# Patient Record
Sex: Female | Born: 1980 | Race: White | Hispanic: No | Marital: Married | Smoking: Never smoker
Health system: Southern US, Community
[De-identification: ages and names within clinical notes are randomized; demographics above are authoritative.]

## PROBLEM LIST (undated history)

## (undated) DIAGNOSIS — E669 Obesity, unspecified: Secondary | ICD-10-CM

## (undated) DIAGNOSIS — H9193 Unspecified hearing loss, bilateral: Secondary | ICD-10-CM

## (undated) DIAGNOSIS — R7989 Other specified abnormal findings of blood chemistry: Secondary | ICD-10-CM

## (undated) DIAGNOSIS — G43909 Migraine, unspecified, not intractable, without status migrainosus: Secondary | ICD-10-CM

## (undated) DIAGNOSIS — R599 Enlarged lymph nodes, unspecified: Secondary | ICD-10-CM

## (undated) DIAGNOSIS — M329 Systemic lupus erythematosus, unspecified: Secondary | ICD-10-CM

## (undated) DIAGNOSIS — R161 Splenomegaly, not elsewhere classified: Secondary | ICD-10-CM

## (undated) DIAGNOSIS — I7300 Raynaud's syndrome without gangrene: Secondary | ICD-10-CM

## (undated) DIAGNOSIS — F419 Anxiety disorder, unspecified: Secondary | ICD-10-CM

## (undated) DIAGNOSIS — D72819 Decreased white blood cell count, unspecified: Secondary | ICD-10-CM

## (undated) DIAGNOSIS — F329 Major depressive disorder, single episode, unspecified: Secondary | ICD-10-CM

## (undated) DIAGNOSIS — K3184 Gastroparesis: Secondary | ICD-10-CM

## (undated) DIAGNOSIS — F32A Depression, unspecified: Secondary | ICD-10-CM

## (undated) DIAGNOSIS — IMO0002 Reserved for concepts with insufficient information to code with codable children: Secondary | ICD-10-CM

## (undated) DIAGNOSIS — R160 Hepatomegaly, not elsewhere classified: Secondary | ICD-10-CM

## (undated) DIAGNOSIS — H9192 Unspecified hearing loss, left ear: Secondary | ICD-10-CM

## (undated) HISTORY — DX: Obesity, unspecified: E66.9

## (undated) HISTORY — DX: Unspecified hearing loss, bilateral: H91.93

## (undated) HISTORY — DX: Unspecified hearing loss, left ear: H91.92

## (undated) HISTORY — DX: Major depressive disorder, single episode, unspecified: F32.9

## (undated) HISTORY — DX: Anxiety disorder, unspecified: F41.9

## (undated) HISTORY — DX: Splenomegaly, not elsewhere classified: R16.1

## (undated) HISTORY — DX: Depression, unspecified: F32.A

## (undated) HISTORY — DX: Systemic lupus erythematosus, unspecified: M32.9

## (undated) HISTORY — PX: CHOLECYSTECTOMY: SHX55

## (undated) HISTORY — DX: Raynaud's syndrome without gangrene: I73.00

## (undated) HISTORY — DX: Other specified abnormal findings of blood chemistry: R79.89

## (undated) HISTORY — DX: Decreased white blood cell count, unspecified: D72.819

## (undated) HISTORY — PX: OTHER SURGICAL HISTORY: SHX169

## (undated) HISTORY — DX: Hepatomegaly, not elsewhere classified: R16.0

## (undated) HISTORY — DX: Enlarged lymph nodes, unspecified: R59.9

## (undated) HISTORY — DX: Migraine, unspecified, not intractable, without status migrainosus: G43.909

## (undated) HISTORY — PX: TONSILLECTOMY AND ADENOIDECTOMY: SUR1326

---

## 2007-09-15 HISTORY — PX: TONSILLECTOMY AND ADENOIDECTOMY: SUR1326

## 2014-04-02 ENCOUNTER — Ambulatory Visit
Admission: RE | Admit: 2014-04-02 | Discharge: 2014-04-02 | Disposition: A | Discharge status: Home / Self Care | Payer: BC Managed Care – PPO | Source: Ambulatory Visit | Attending: Rheumatology | Admitting: Rheumatology

## 2014-04-02 ENCOUNTER — Other Ambulatory Visit: Payer: Self-pay | Admitting: Rheumatology

## 2014-04-02 DIAGNOSIS — R109 Unspecified abdominal pain: Secondary | ICD-10-CM

## 2014-04-02 MED ORDER — IOHEXOL 300 MG/ML  SOLN
125.0000 mL | Freq: Once | INTRAMUSCULAR | Status: AC | PRN
  Administered 2014-04-02: 125 mL via INTRAVENOUS

## 2014-04-06 ENCOUNTER — Other Ambulatory Visit: Payer: Self-pay | Admitting: Rheumatology

## 2014-04-06 ENCOUNTER — Ambulatory Visit
Admission: RE | Admit: 2014-04-06 | Discharge: 2014-04-06 | Disposition: A | Discharge status: Home / Self Care | Payer: BC Managed Care – PPO | Source: Ambulatory Visit | Attending: Rheumatology | Admitting: Rheumatology

## 2014-04-06 DIAGNOSIS — R079 Chest pain, unspecified: Secondary | ICD-10-CM

## 2014-04-06 DIAGNOSIS — R0602 Shortness of breath: Secondary | ICD-10-CM

## 2014-04-06 MED ORDER — IOHEXOL 350 MG/ML SOLN
125.0000 mL | Freq: Once | INTRAVENOUS | Status: AC | PRN
  Administered 2014-04-06: 125 mL via INTRAVENOUS

## 2014-04-13 ENCOUNTER — Other Ambulatory Visit (HOSPITAL_COMMUNITY): Payer: Self-pay | Admitting: Gastroenterology

## 2014-04-13 DIAGNOSIS — R1110 Vomiting, unspecified: Secondary | ICD-10-CM

## 2014-04-19 ENCOUNTER — Encounter (HOSPITAL_COMMUNITY): Payer: Self-pay | Admitting: Emergency Medicine

## 2014-04-19 ENCOUNTER — Inpatient Hospital Stay (HOSPITAL_COMMUNITY)
Admission: EM | Admit: 2014-04-19 | Discharge: 2014-04-23 | DRG: 690 | Disposition: A | Discharge status: Home / Self Care | Payer: BC Managed Care – PPO | Attending: Internal Medicine | Admitting: Internal Medicine

## 2014-04-19 ENCOUNTER — Emergency Department (HOSPITAL_COMMUNITY): Payer: BC Managed Care – PPO

## 2014-04-19 DIAGNOSIS — R509 Fever, unspecified: Secondary | ICD-10-CM | POA: Diagnosis present

## 2014-04-19 DIAGNOSIS — G8929 Other chronic pain: Secondary | ICD-10-CM | POA: Diagnosis present

## 2014-04-19 DIAGNOSIS — E669 Obesity, unspecified: Secondary | ICD-10-CM | POA: Diagnosis present

## 2014-04-19 DIAGNOSIS — E860 Dehydration: Secondary | ICD-10-CM | POA: Diagnosis present

## 2014-04-19 DIAGNOSIS — J40 Bronchitis, not specified as acute or chronic: Secondary | ICD-10-CM | POA: Diagnosis present

## 2014-04-19 DIAGNOSIS — Z8249 Family history of ischemic heart disease and other diseases of the circulatory system: Secondary | ICD-10-CM

## 2014-04-19 DIAGNOSIS — M329 Systemic lupus erythematosus, unspecified: Secondary | ICD-10-CM | POA: Diagnosis present

## 2014-04-19 DIAGNOSIS — Z7982 Long term (current) use of aspirin: Secondary | ICD-10-CM | POA: Diagnosis not present

## 2014-04-19 DIAGNOSIS — K3184 Gastroparesis: Secondary | ICD-10-CM | POA: Diagnosis present

## 2014-04-19 DIAGNOSIS — E876 Hypokalemia: Secondary | ICD-10-CM | POA: Diagnosis present

## 2014-04-19 DIAGNOSIS — R51 Headache: Secondary | ICD-10-CM | POA: Diagnosis present

## 2014-04-19 DIAGNOSIS — IMO0002 Reserved for concepts with insufficient information to code with codable children: Secondary | ICD-10-CM

## 2014-04-19 DIAGNOSIS — R000 Tachycardia, unspecified: Secondary | ICD-10-CM | POA: Diagnosis present

## 2014-04-19 DIAGNOSIS — T380X5A Adverse effect of glucocorticoids and synthetic analogues, initial encounter: Secondary | ICD-10-CM | POA: Diagnosis present

## 2014-04-19 DIAGNOSIS — J329 Chronic sinusitis, unspecified: Secondary | ICD-10-CM | POA: Diagnosis present

## 2014-04-19 DIAGNOSIS — D899 Disorder involving the immune mechanism, unspecified: Secondary | ICD-10-CM | POA: Diagnosis present

## 2014-04-19 DIAGNOSIS — R197 Diarrhea, unspecified: Secondary | ICD-10-CM | POA: Diagnosis present

## 2014-04-19 DIAGNOSIS — N390 Urinary tract infection, site not specified: Principal | ICD-10-CM | POA: Diagnosis present

## 2014-04-19 HISTORY — DX: Reserved for concepts with insufficient information to code with codable children: IMO0002

## 2014-04-19 HISTORY — DX: Systemic lupus erythematosus, unspecified: M32.9

## 2014-04-19 LAB — BASIC METABOLIC PANEL
Anion gap: 13 (ref 5–15)
BUN: 6 mg/dL (ref 6–23)
CHLORIDE: 97 meq/L (ref 96–112)
CO2: 23 mEq/L (ref 19–32)
CREATININE: 0.94 mg/dL (ref 0.50–1.10)
Calcium: 9.9 mg/dL (ref 8.4–10.5)
GFR calc Af Amer: >90 mL/min (ref >90)
GFR calc non Af Amer: 78 mL/min — ABNORMAL LOW (ref >90)
GLUCOSE: 112 mg/dL — AB (ref 70–99)
POTASSIUM: 4.3 meq/L (ref 3.7–5.3)
Sodium: 133 mEq/L — ABNORMAL LOW (ref 137–147)

## 2014-04-19 LAB — URINE MICROSCOPIC-ADD ON

## 2014-04-19 LAB — URINALYSIS, ROUTINE W REFLEX MICROSCOPIC
BILIRUBIN URINE: NEGATIVE
GLUCOSE, UA: NEGATIVE mg/dL
HGB URINE DIPSTICK: NEGATIVE
Ketones, ur: NEGATIVE mg/dL
Nitrite: NEGATIVE
PH: 5.5 (ref 5.0–8.0)
Protein, ur: NEGATIVE mg/dL
SPECIFIC GRAVITY, URINE: 1.018 (ref 1.005–1.030)
Urobilinogen, UA: 0.2 mg/dL (ref 0.0–1.0)

## 2014-04-19 LAB — CBC WITH DIFFERENTIAL/PLATELET
Basophils Absolute: 0 10*3/uL (ref 0.0–0.1)
Basophils Relative: 0 % (ref 0–1)
EOS ABS: 0 10*3/uL (ref 0.0–0.7)
Eosinophils Relative: 0 % (ref 0–5)
HCT: 39 % (ref 36.0–46.0)
HEMOGLOBIN: 12.7 g/dL (ref 12.0–15.0)
LYMPHS ABS: 0.4 10*3/uL — AB (ref 0.7–4.0)
Lymphocytes Relative: 3 % — ABNORMAL LOW (ref 12–46)
MCH: 27.9 pg (ref 26.0–34.0)
MCHC: 32.6 g/dL (ref 30.0–36.0)
MCV: 85.5 fL (ref 78.0–100.0)
Monocytes Absolute: 0.5 10*3/uL (ref 0.1–1.0)
Monocytes Relative: 4 % (ref 3–12)
NEUTROS ABS: 11.8 10*3/uL — AB (ref 1.7–7.7)
NEUTROS PCT: 92 % — AB (ref 43–77)
Platelets: 313 10*3/uL (ref 150–400)
RBC: 4.56 MIL/uL (ref 3.87–5.11)
RDW: 15.9 % — ABNORMAL HIGH (ref 11.5–15.5)
WBC: 12.9 10*3/uL — ABNORMAL HIGH (ref 4.0–10.5)

## 2014-04-19 LAB — POC URINE PREG, ED: Preg Test, Ur: NEGATIVE

## 2014-04-19 LAB — CK: Total CK: 510 U/L — ABNORMAL HIGH (ref 7–177)

## 2014-04-19 LAB — I-STAT TROPONIN, ED: Troponin i, poc: 0.01 ng/mL (ref 0.00–0.08)

## 2014-04-19 LAB — I-STAT CG4 LACTIC ACID, ED: Lactic Acid, Venous: 1.5 mmol/L (ref 0.5–2.2)

## 2014-04-19 MED ORDER — SODIUM CHLORIDE 0.9 % IV BOLUS (SEPSIS)
1000.0000 mL | Freq: Once | INTRAVENOUS | Status: AC
  Administered 2014-04-19: 1000 mL via INTRAVENOUS

## 2014-04-19 MED ORDER — ACETAMINOPHEN 500 MG PO TABS
1000.0000 mg | ORAL_TABLET | Freq: Once | ORAL | Status: AC
  Administered 2014-04-19: 1000 mg via ORAL
  Filled 2014-04-19: qty 2

## 2014-04-19 NOTE — H&P (Addendum)
Patient's PCP: Trinna Post, MD Patient's rheumatologist: Dr. Dareen Piano Patient's gastroenterologist: Dr. Dulce Sellar  Chief Complaint: Fever  History of Present Illness: Monica Ashley is a 33 y.o. 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  Patient reports that her symptoms started on 04/13/2014 when she had a gastroenterology appointment with Dr. Dulce Sellar, for chronic nausea, epigastric pain, and diarrhea.  She indicated that she had a temperature of 101.6 at the clinic visit.  She followed up with her rheumatologist on 03/16/2014 because she was having fevers on and off and was having generalized malaise.  She indicated that during her clinic visit she had a temperature of 99.1, but was started on empiric ciprofloxacin.  Despite being on ciprofloxacin, she reports that she has had on and off episodes of fever at home.  In the last 2 days, she indicates that she has had a worsening of her symptoms.  Due to her fever and generalized malaise she was tearful earlier today.  As a result she presented to the emergency department for further evaluation.  In the emergency department she was found to have a temperature of 100.4.  Hospitalist service was asked to admit the patient for further care and management.  Patient reports she has chronic nausea but has not vomited.  Denies any chest pain or shortness of breath.  Does complain of chronic lower abdominal pain.  Reports having diarrhea almost daily with no blood.  Denies any dysuria.  Denies any vision changes.  Does complain of frontal headache which is different from her migraine headaches.  Patient denies any recent travel or sick contacts.  Review of Systems: All systems reviewed with the patient and positive as per history of present illness, otherwise all other systems are negative.  Past Medical History  Diagnosis Date  . Lupus    Past Surgical History  Procedure  Laterality Date  . Cholecystectomy    . Cesarean section    . Tonsillectomy and adenoidectomy     Family History  Problem Relation Age of Onset  . Gout Mother   . Hypertension Mother   . Gout Father    History   Social History  . Marital Status: Married    Spouse Name: N/A    Number of Children: N/A  . Years of Education: N/A   Occupational History  . Not on file.   Social History Main Topics  . Smoking status: Never Smoker   . Smokeless tobacco: Not on file  . Alcohol Use: No  . Drug Use: No  . Sexual Activity: Not on file   Other Topics Concern  . Not on file   Social History Narrative  . No narrative on file   Allergies: Codeine  Home Meds: Prior to Admission medications   Medication Sig Start Date End Date Taking? Authorizing Provider  acetaminophen (TYLENOL) 500 MG tablet Take 1,000 mg by mouth every 6 (six) hours as needed for mild pain.   Yes Historical Provider, MD  aspirin 325 MG tablet Take 325 mg by mouth daily.   Yes Historical Provider, MD  cholecalciferol (VITAMIN D) 1000 UNITS tablet Take 1,000 Units by mouth daily.   Yes Historical Provider, MD  ciprofloxacin (CIPRO) 500 MG tablet Take 500 mg by mouth 2 (two) times daily.   Yes Historical Provider, MD  diphenhydrAMINE (BENADRYL) 25 MG tablet Take 25 mg by mouth at bedtime as needed for sleep.   Yes Historical Provider, MD  hydroxychloroquine (PLAQUENIL) 200 MG tablet Take 400 mg by mouth daily.   Yes Historical Provider, MD  omeprazole (PRILOSEC) 20 MG capsule Take 20 mg by mouth daily.   Yes Historical Provider, MD  ondansetron (ZOFRAN) 8 MG tablet Take 4-8 mg by mouth every 8 (eight) hours as needed for nausea or vomiting.   Yes Historical Provider, MD  predniSONE (DELTASONE) 10 MG tablet Take 30 mg by mouth daily with breakfast.   Yes Historical Provider, MD  traMADol (ULTRAM) 50 MG tablet Take 50-100 mg by mouth 4 (four) times daily as needed for moderate pain.   Yes Historical Provider, MD     Physical Exam: Blood pressure 122/57, pulse 93, temperature 99.4 F (37.4 C), temperature source Oral, resp. rate 17, last menstrual period 01/01/2014, SpO2 99.00%. General: Awake, Oriented x3, No acute distress. HEENT: EOMI, Moist mucous membranes Neck: Supple CV: S1 and S2 Lungs: Clear to ascultation bilaterally Abdomen: Soft, Nontender, Nondistended, +bowel sounds. Ext: Good pulses. Trace edema. No clubbing or cyanosis noted. Neuro: Cranial Nerves II-XII grossly intact. Has 5/5 motor strength in upper and lower extremities.  Lab results:  Recent Labs  04/19/14 1914  NA 133*  K 4.3  CL 97  CO2 23  GLUCOSE 112*  BUN 6  CREATININE 0.94  CALCIUM 9.9   No results found for this basename: AST, ALT, ALKPHOS, BILITOT, PROT, ALBUMIN,  in the last 72 hours No results found for this basename: LIPASE, AMYLASE,  in the last 72 hours  Recent Labs  04/19/14 1914  WBC 12.9*  NEUTROABS 11.8*  HGB 12.7  HCT 39.0  MCV 85.5  PLT 313    Recent Labs  04/19/14 1914  CKTOTAL 510*   No components found with this basename: POCBNP,  No results found for this basename: DDIMER,  in the last 72 hours No results found for this basename: HGBA1C,  in the last 72 hours No results found for this basename: CHOL, HDL, LDLCALC, TRIG, CHOLHDL, LDLDIRECT,  in the last 72 hours No results found for this basename: TSH, T4TOTAL, FREET3, T3FREE, THYROIDAB,  in the last 72 hours No results found for this basename: VITAMINB12, FOLATE, FERRITIN, TIBC, IRON, RETICCTPCT,  in the last 72 hours Imaging results:  Dg Chest 2 View  04/19/2014   CLINICAL DATA:  Chest pain, lupus  EXAM: CHEST  2 VIEW  COMPARISON:  04/06/2014, 10/23/2013  FINDINGS: The heart size and mediastinal contours are within normal limits. Both lungs are clear. The visualized skeletal structures are unremarkable.  IMPRESSION: No active cardiopulmonary disease.   Electronically Signed   By: Ruel Favors M.D.   On: 04/19/2014 20:00   Ct  Angio Chest Pe W/cm &/or Wo Cm  04/06/2014   CLINICAL DATA:  Mid posterior chest pain over the past 5 months, worse over the past 1 month. Cough and shortness of breath over the past 1 week. Recent diagnosis of lupus.  EXAM: CT ANGIOGRAPHY CHEST WITH CONTRAST  TECHNIQUE: Multidetector CT imaging of the chest was performed using the standard protocol during bolus administration of intravenous contrast. Multiplanar CT image reconstructions and MIPs were obtained to evaluate the vascular anatomy.  CONTRAST:  OMNIPAQUE IOHEXOL 350 MG/ML IV.  COMPARISON:  No prior CT.  Two-view chest x-ray 10/23/2013.  FINDINGS: Contrast opacification of the pulmonary arteries is good. Respiratory motion blurred some of the images, but the study appears diagnostic.  No filling defects within either main pulmonary artery or their branches in either lung to suggest pulmonary  embolism. Heart upper normal in size to minimally enlarged with mild left ventricular hypertrophy. No pericardial effusion. No visible coronary atherosclerosis. No visible atherosclerosis involving the thoracic or upper abdominal aorta or their visualized branches.  Pulmonary parenchyma clear without localized airspace consolidation, interstitial disease, or parenchymal nodules or masses. Central airways patent without significant bronchial wall thickening. No pleural effusions.  No significant mediastinal, hilar, or axillary lymphadenopathy. Visualized thyroid gland unremarkable.  Mild diffuse steatosis involving the visualized liver without focal hepatic parenchymal abnormality. Scattered diverticula involving the proximal descending colon. Focus of accessory splenic tissue anterior and medial to the spleen at the hilum. Bone window images demonstrate thoracic spondylosis.  Review of the MIP images confirms the above findings.  IMPRESSION: 1. No evidence of pulmonary embolism. 2.  No acute cardiopulmonary disease. 3. Borderline heart size with mild left  ventricular hypertrophy. 4. Mild diffuse steatosis involving the visualized liver without focal hepatic parenchymal abnormality. 5. Diverticulosis involving the visualized proximal descending colon.   Electronically Signed   By: Hulan Saashomas  Lawrence M.D.   On: 04/06/2014 11:13   Ct Abdomen W Contrast  04/02/2014   CLINICAL DATA:  Severe epigastric pain. Anorexia. 40 lb weight loss in past 6 months.  EXAM: CT ABDOMEN WITH CONTRAST  TECHNIQUE: Multidetector CT imaging of the abdomen was performed using the standard protocol following bolus administration of intravenous contrast.  CONTRAST:  125mL OMNIPAQUE IOHEXOL 300 MG/ML  SOLN  COMPARISON:  None.  FINDINGS: Ectopic location of left kidney noted in the left lower quadrant. No renal masses or hydronephrosis seen involving either kidney.  Surgical clips from prior cholecystectomy noted. No evidence of biliary dilatation. The liver, pancreas, spleen, and adrenal glands are normal in appearance. No soft tissue masses or lymphadenopathy identified.  Colonic diverticulosis is noted, however there is no evidence of diverticulitis or other inflammatory process in the visualized portion the abdomen. The visualized abdominal bowel loops are otherwise unremarkable in appearance. No abnormal fluid collections identified.  IMPRESSION: No acute findings.  Diverticulosis. No radiographic evidence of diverticulitis within the visualized abdomen.  Left pelvic kidney.   Electronically Signed   By: Myles RosenthalJohn  Stahl M.D.   On: 04/02/2014 13:54   Other results: EKG: Sinus tachycardia with PVCs.  Assessment & Plan by Problem: Fever Unclear etiology.  Blood cultures x2 and urine urine culture sent and pending.  Chest x-ray does not show any signs of infectious etiology.  Urinalysis negative.  Question if fever is related to patient's lupus.  If patient has persistent fevers with negative cultures, may consider infectious disease consult.  Broad differential, viral versus bacterial.   Patient is immunocompromised due to her chronic steroid use.  Signed for HIV.  May also consider discussing with her rheumatologist regarding persistent fever with her her lupus may be triggering her fever.  Hold patient's ciprofloxacin for now as there is no clear indication for treatment.  Have not started the patient on any antibiotics at this time, however if any hemodynamic instability low threshold for starting the patient on antibiotics.  Lupus Continue prednisone and hydroxychloroquine.  Management as per her rheumatologist.  Obesity Diet and exercise as outpatient.  Diarrhea Send for stool culture and C. difficile given fever.  If negative defer further workup to patient's gastroenterologist.  Tachycardia Likely related to fever.  Continue to monitor.  Leukocytosis Likely related to fever and chronic steroid use.  Prophylaxis Lovenox.  CODE STATUS Full code.  Disposition Admit the patient as inpatient medical bed.  Time spent on  admission, talking to the patient, and coordinating care was: 50 mins.  Laniah Grimm A, MD 04/19/2014, 11:09 PM

## 2014-04-19 NOTE — ED Provider Notes (Signed)
CSN: [id number]     Arrival date & time 04/19/14  1802 History   First MD Initiated Contact with Patient 04/19/14 1827     Chief Complaint  Patient presents with  . Fever  . Generalized Body Aches     (Consider location/radiation/quality/duration/timing/severity/associated sxs/prior Treatment) HPI Comments: Patient presents to the emergency department with chief complaint of fever, generalized body aches. She states that she has a recent diagnosis of lupus. She states that she's been running a fever since Monday. She denies any cough, or dysuria. She states that she has chronic abdominal pain with nausea, vomiting, diarrhea. She recently had a CT scan of her chest and abdomen. She was then scheduled for an upper endoscopy. She has not had this yet. States there no aggravating or alleviating factors. She takes 40 mg prednisone daily, but this was recently decreased to 30 mg.   The history is provided by the patient. No language interpreter was used.    Past Medical History  Diagnosis Date  . Lupus    Past Surgical History  Procedure Laterality Date  . Cholecystectomy    . Cesarean section    . Tonsillectomy and adenoidectomy     History reviewed. No pertinent family history. History  Substance Use Topics  . Smoking status: Never Smoker   . Smokeless tobacco: Not on file  . Alcohol Use: No   OB History   Grav Para Term Preterm Abortions TAB SAB Ect Mult Living                 Review of Systems  Constitutional: Negative for fever and chills.  Respiratory: Negative for shortness of breath.   Cardiovascular: Negative for chest pain.  Gastrointestinal: Negative for nausea, vomiting, diarrhea and constipation.  Genitourinary: Negative for dysuria.  All other systems reviewed and are negative.     Allergies  Codeine  Home Medications   Prior to Admission medications   Medication Sig Start Date End Date Taking? Authorizing Provider  acetaminophen (TYLENOL) 500 MG tablet  Take 1,000 mg by mouth every 6 (six) hours as needed for mild pain.   Yes Historical Provider, MD  aspirin 325 MG tablet Take 325 mg by mouth daily.   Yes Historical Provider, MD  cholecalciferol (VITAMIN D) 1000 UNITS tablet Take 1,000 Units by mouth daily.   Yes Historical Provider, MD  ciprofloxacin (CIPRO) 500 MG tablet Take 500 mg by mouth 2 (two) times daily.   Yes Historical Provider, MD  diphenhydrAMINE (BENADRYL) 25 MG tablet Take 25 mg by mouth at bedtime as needed for sleep.   Yes Historical Provider, MD  hydroxychloroquine (PLAQUENIL) 200 MG tablet Take 400 mg by mouth daily.   Yes Historical Provider, MD  omeprazole (PRILOSEC) 20 MG capsule Take 20 mg by mouth daily.   Yes Historical Provider, MD  ondansetron (ZOFRAN) 8 MG tablet Take 4-8 mg by mouth every 8 (eight) hours as needed for nausea or vomiting.   Yes Historical Provider, MD  predniSONE (DELTASONE) 10 MG tablet Take 30 mg by mouth daily with breakfast.   Yes Historical Provider, MD  traMADol (ULTRAM) 50 MG tablet Take 50-100 mg by mouth 4 (four) times daily as needed for moderate pain.   Yes Historical Provider, MD   BP 112/75  Pulse 117  Temp(Src) 100.4 F (38 C) (Oral)  Resp 15  SpO2 97%  LMP 01/01/2014 Physical Exam  Nursing note and vitals reviewed. Constitutional: She is oriented to person, place, and time. She appears well-developed  and well-nourished.  HENT:  Head: Normocephalic and atraumatic.  Eyes: Conjunctivae and EOM are normal. Pupils are equal, round, and reactive to light.  Neck: Normal range of motion. Neck supple.  Cardiovascular: Normal rate and regular rhythm.  Exam reveals no gallop and no friction rub.   No murmur heard. Pulmonary/Chest: Effort normal and breath sounds normal. No respiratory distress. She has no wheezes. She has no rales. She exhibits no tenderness.  Abdominal: Soft. Bowel sounds are normal. She exhibits no distension and no mass. There is no tenderness. There is no rebound and  no guarding.  Musculoskeletal: Normal range of motion. She exhibits no edema and no tenderness.  Neurological: She is alert and oriented to person, place, and time.  Skin: Skin is warm and dry.  Psychiatric: She has a normal mood and affect. Her behavior is normal. Judgment and thought content normal.    ED Course  Procedures (including critical care time) Results for orders placed during the hospital encounter of 04/19/14  URINALYSIS, ROUTINE W REFLEX MICROSCOPIC      Result Value Ref Range   Color, Urine YELLOW  YELLOW   APPearance CLOUDY (*) CLEAR   Specific Gravity, Urine 1.018  1.005 - 1.030   pH 5.5  5.0 - 8.0   Glucose, UA NEGATIVE  NEGATIVE mg/dL   Hgb urine dipstick NEGATIVE  NEGATIVE   Bilirubin Urine NEGATIVE  NEGATIVE   Ketones, ur NEGATIVE  NEGATIVE mg/dL   Protein, ur NEGATIVE  NEGATIVE mg/dL   Urobilinogen, UA 0.2  0.0 - 1.0 mg/dL   Nitrite NEGATIVE  NEGATIVE   Leukocytes, UA TRACE (*) NEGATIVE  CBC WITH DIFFERENTIAL      Result Value Ref Range   WBC 12.9 (*) 4.0 - 10.5 K/uL   RBC 4.56  3.87 - 5.11 MIL/uL   Hemoglobin 12.7  12.0 - 15.0 g/dL   HCT 16.1  09.6 - 04.5 %   MCV 85.5  78.0 - 100.0 fL   MCH 27.9  26.0 - 34.0 pg   MCHC 32.6  30.0 - 36.0 g/dL   RDW 40.9 (*) 81.1 - 91.4 %   Platelets 313  150 - 400 K/uL   Neutrophils Relative % 92 (*) 43 - 77 %   Neutro Abs 11.8 (*) 1.7 - 7.7 K/uL   Lymphocytes Relative 3 (*) 12 - 46 %   Lymphs Abs 0.4 (*) 0.7 - 4.0 K/uL   Monocytes Relative 4  3 - 12 %   Monocytes Absolute 0.5  0.1 - 1.0 K/uL   Eosinophils Relative 0  0 - 5 %   Eosinophils Absolute 0.0  0.0 - 0.7 K/uL   Basophils Relative 0  0 - 1 %   Basophils Absolute 0.0  0.0 - 0.1 K/uL  BASIC METABOLIC PANEL      Result Value Ref Range   Sodium 133 (*) 137 - 147 mEq/L   Potassium 4.3  3.7 - 5.3 mEq/L   Chloride 97  96 - 112 mEq/L   CO2 23  19 - 32 mEq/L   Glucose, Bld 112 (*) 70 - 99 mg/dL   BUN 6  6 - 23 mg/dL   Creatinine, Ser 7.82  0.50 - 1.10 mg/dL    Calcium 9.9  8.4 - 95.6 mg/dL   GFR calc non Af Amer 78 (*) >90 mL/min   GFR calc Af Amer >90  >90 mL/min   Anion gap 13  5 - 15  URINE MICROSCOPIC-ADD ON  Result Value Ref Range   Squamous Epithelial / LPF RARE  RARE   WBC, UA 0-2  <3 WBC/hpf   Bacteria, UA RARE  RARE   Casts HYALINE CASTS (*) NEGATIVE   Urine-Other MUCOUS PRESENT    I-STAT TROPOININ, ED      Result Value Ref Range   Troponin i, poc 0.01  0.00 - 0.08 ng/mL   Comment 3           POC URINE PREG, ED      Result Value Ref Range   Preg Test, Ur NEGATIVE  NEGATIVE  I-STAT CG4 LACTIC ACID, ED      Result Value Ref Range   Lactic Acid, Venous 1.50  0.5 - 2.2 mmol/L   Dg Chest 2 View  04/19/2014   CLINICAL DATA:  Chest pain, lupus  EXAM: CHEST  2 VIEW  COMPARISON:  04/06/2014, 10/23/2013  FINDINGS: The heart size and mediastinal contours are within normal limits. Both lungs are clear. The visualized skeletal structures are unremarkable.  IMPRESSION: No active cardiopulmonary disease.   Electronically Signed   By: Ruel Favors M.D.   On: 04/19/2014 20:00   Ct Angio Chest Pe W/cm &/or Wo Cm  04/06/2014   CLINICAL DATA:  Mid posterior chest pain over the past 5 months, worse over the past 1 month. Cough and shortness of breath over the past 1 week. Recent diagnosis of lupus.  EXAM: CT ANGIOGRAPHY CHEST WITH CONTRAST  TECHNIQUE: Multidetector CT imaging of the chest was performed using the standard protocol during bolus administration of intravenous contrast. Multiplanar CT image reconstructions and MIPs were obtained to evaluate the vascular anatomy.  CONTRAST:  OMNIPAQUE IOHEXOL 350 MG/ML IV.  COMPARISON:  No prior CT.  Two-view chest x-ray 10/23/2013.  FINDINGS: Contrast opacification of the pulmonary arteries is good. Respiratory motion blurred some of the images, but the study appears diagnostic.  No filling defects within either main pulmonary artery or their branches in either lung to suggest pulmonary embolism. Heart  upper normal in size to minimally enlarged with mild left ventricular hypertrophy. No pericardial effusion. No visible coronary atherosclerosis. No visible atherosclerosis involving the thoracic or upper abdominal aorta or their visualized branches.  Pulmonary parenchyma clear without localized airspace consolidation, interstitial disease, or parenchymal nodules or masses. Central airways patent without significant bronchial wall thickening. No pleural effusions.  No significant mediastinal, hilar, or axillary lymphadenopathy. Visualized thyroid gland unremarkable.  Mild diffuse steatosis involving the visualized liver without focal hepatic parenchymal abnormality. Scattered diverticula involving the proximal descending colon. Focus of accessory splenic tissue anterior and medial to the spleen at the hilum. Bone window images demonstrate thoracic spondylosis.  Review of the MIP images confirms the above findings.  IMPRESSION: 1. No evidence of pulmonary embolism. 2.  No acute cardiopulmonary disease. 3. Borderline heart size with mild left ventricular hypertrophy. 4. Mild diffuse steatosis involving the visualized liver without focal hepatic parenchymal abnormality. 5. Diverticulosis involving the visualized proximal descending colon.   Electronically Signed   By: Hulan Saas M.D.   On: 04/06/2014 11:13   Ct Abdomen W Contrast  04/02/2014   CLINICAL DATA:  Severe epigastric pain. Anorexia. 40 lb weight loss in past 6 months.  EXAM: CT ABDOMEN WITH CONTRAST  TECHNIQUE: Multidetector CT imaging of the abdomen was performed using the standard protocol following bolus administration of intravenous contrast.  CONTRAST:  OMNIPAQUE IOHEXOL 300 MG/ML  SOLN  COMPARISON:  None.  FINDINGS: Ectopic location of left kidney  noted in the left lower quadrant. No renal masses or hydronephrosis seen involving either kidney.  Surgical clips from prior cholecystectomy noted. No evidence of biliary dilatation. The liver,  pancreas, spleen, and adrenal glands are normal in appearance. No soft tissue masses or lymphadenopathy identified.  Colonic diverticulosis is noted, however there is no evidence of diverticulitis or other inflammatory process in the visualized portion the abdomen. The visualized abdominal bowel loops are otherwise unremarkable in appearance. No abnormal fluid collections identified.  IMPRESSION: No acute findings.  Diverticulosis. No radiographic evidence of diverticulitis within the visualized abdomen.  Left pelvic kidney.   Electronically Signed   By: Myles Rosenthal M.D.   On: 04/02/2014 13:54     Imaging Review Dg Chest 2 View  04/19/2014   CLINICAL DATA:  Chest pain, lupus  EXAM: CHEST  2 VIEW  COMPARISON:  04/06/2014, 10/23/2013  FINDINGS: The heart size and mediastinal contours are within normal limits. Both lungs are clear. The visualized skeletal structures are unremarkable.  IMPRESSION: No active cardiopulmonary disease.   Electronically Signed   By: Ruel Favors M.D.   On: 04/19/2014 20:00     EKG Interpretation None      MDM   Final diagnoses:  Fever, unspecified fever cause    Patient with fever for the past 3 days. She has a new diagnosis of lupus. Check labs. Patient was sent here by her rheumatologist for further evaluation.  No evidence of acute infectious process during workup today. Patient seen by and discussed with Dr. Micheline Maze, who recommends admission in this immunocompromised patient.    Roxy Horseman, PA-C 04/19/14 2253

## 2014-04-19 NOTE — ED Notes (Signed)
PA at bedside. Will obtain labs when PA is finished.

## 2014-04-19 NOTE — ED Notes (Signed)
Hospitalist MD at bedside. 

## 2014-04-19 NOTE — ED Notes (Signed)
Pt been dx with lupus.  Pt has been having fever since Monday.  Pt states generalized pain and fever. "Doesn't feel well".  Notified Rheumatologist and told to come to ED if fever persists.

## 2014-04-20 LAB — CBC
HCT: 38.3 % (ref 36.0–46.0)
HEMOGLOBIN: 12.1 g/dL (ref 12.0–15.0)
MCH: 27.3 pg (ref 26.0–34.0)
MCHC: 31.6 g/dL (ref 30.0–36.0)
MCV: 86.5 fL (ref 78.0–100.0)
PLATELETS: 287 10*3/uL (ref 150–400)
RBC: 4.43 MIL/uL (ref 3.87–5.11)
RDW: 15.9 % — ABNORMAL HIGH (ref 11.5–15.5)
WBC: 8.9 10*3/uL (ref 4.0–10.5)

## 2014-04-20 LAB — BASIC METABOLIC PANEL
Anion gap: 10 (ref 5–15)
BUN: 8 mg/dL (ref 6–23)
CHLORIDE: 103 meq/L (ref 96–112)
CO2: 25 mEq/L (ref 19–32)
Calcium: 10.2 mg/dL (ref 8.4–10.5)
Creatinine, Ser: 0.78 mg/dL (ref 0.50–1.10)
Glucose, Bld: 104 mg/dL — ABNORMAL HIGH (ref 70–99)
POTASSIUM: 4.4 meq/L (ref 3.7–5.3)
SODIUM: 138 meq/L (ref 137–147)

## 2014-04-20 LAB — HIV ANTIBODY (ROUTINE TESTING W REFLEX): HIV 1&2 Ab, 4th Generation: NONREACTIVE

## 2014-04-20 LAB — CLOSTRIDIUM DIFFICILE BY PCR: CDIFFPCR: NEGATIVE

## 2014-04-20 MED ORDER — HYDROMORPHONE HCL PF 1 MG/ML IJ SOLN
1.0000 mg | INTRAMUSCULAR | Status: DC | PRN
  Administered 2014-04-21: 1 mg via INTRAVENOUS
  Filled 2014-04-20: qty 1

## 2014-04-20 MED ORDER — DEXTROSE 5 % IV SOLN
1.0000 g | Freq: Every day | INTRAVENOUS | Status: DC
  Administered 2014-04-20 – 2014-04-22 (×3): 1 g via INTRAVENOUS
  Filled 2014-04-20 (×4): qty 10

## 2014-04-20 MED ORDER — PREDNISONE 20 MG PO TABS
30.0000 mg | ORAL_TABLET | Freq: Every day | ORAL | Status: DC
  Administered 2014-04-20: 30 mg via ORAL
  Filled 2014-04-20 (×3): qty 1

## 2014-04-20 MED ORDER — ENOXAPARIN SODIUM 40 MG/0.4ML ~~LOC~~ SOLN
40.0000 mg | SUBCUTANEOUS | Status: DC
  Administered 2014-04-20 – 2014-04-23 (×4): 40 mg via SUBCUTANEOUS
  Filled 2014-04-20 (×4): qty 0.4

## 2014-04-20 MED ORDER — ONDANSETRON HCL 4 MG/2ML IJ SOLN
4.0000 mg | Freq: Four times a day (QID) | INTRAMUSCULAR | Status: DC | PRN
  Administered 2014-04-21 – 2014-04-22 (×4): 4 mg via INTRAVENOUS
  Filled 2014-04-20 (×4): qty 2

## 2014-04-20 MED ORDER — VITAMIN D3 25 MCG (1000 UNIT) PO TABS
1000.0000 [IU] | ORAL_TABLET | Freq: Every day | ORAL | Status: DC
  Administered 2014-04-20 – 2014-04-23 (×4): 1000 [IU] via ORAL
  Filled 2014-04-20 (×4): qty 1

## 2014-04-20 MED ORDER — PANTOPRAZOLE SODIUM 40 MG PO TBEC
40.0000 mg | DELAYED_RELEASE_TABLET | Freq: Every day | ORAL | Status: DC
  Administered 2014-04-20 – 2014-04-23 (×4): 40 mg via ORAL
  Filled 2014-04-20 (×4): qty 1

## 2014-04-20 MED ORDER — ONDANSETRON HCL 4 MG PO TABS: 4.0000 mg | ORAL_TABLET | Freq: Four times a day (QID) | ORAL | Status: DC | PRN

## 2014-04-20 MED ORDER — DIPHENHYDRAMINE HCL 25 MG PO CAPS
25.0000 mg | ORAL_CAPSULE | Freq: Every evening | ORAL | Status: DC | PRN
  Administered 2014-04-22: 25 mg via ORAL
  Filled 2014-04-20: qty 1

## 2014-04-20 MED ORDER — HYDROXYCHLOROQUINE SULFATE 200 MG PO TABS
400.0000 mg | ORAL_TABLET | Freq: Every day | ORAL | Status: DC
  Administered 2014-04-20 – 2014-04-23 (×4): 400 mg via ORAL
  Filled 2014-04-20 (×4): qty 2

## 2014-04-20 MED ORDER — ASPIRIN 325 MG PO TABS
325.0000 mg | ORAL_TABLET | Freq: Every day | ORAL | Status: DC
  Administered 2014-04-20 – 2014-04-23 (×4): 325 mg via ORAL
  Filled 2014-04-20 (×4): qty 1

## 2014-04-20 MED ORDER — SODIUM CHLORIDE 0.9 % IV SOLN
INTRAVENOUS | Status: AC
  Administered 2014-04-20 (×2): via INTRAVENOUS

## 2014-04-20 MED ORDER — ACETAMINOPHEN 650 MG RE SUPP: 650.0000 mg | Freq: Four times a day (QID) | RECTAL | Status: DC | PRN

## 2014-04-20 MED ORDER — ACETAMINOPHEN 325 MG PO TABS
650.0000 mg | ORAL_TABLET | Freq: Four times a day (QID) | ORAL | Status: DC | PRN
  Administered 2014-04-20 – 2014-04-21 (×3): 650 mg via ORAL
  Filled 2014-04-20 (×4): qty 2

## 2014-04-20 MED ORDER — TRAMADOL HCL 50 MG PO TABS
50.0000 mg | ORAL_TABLET | Freq: Four times a day (QID) | ORAL | Status: DC | PRN
  Administered 2014-04-20 – 2014-04-21 (×2): 100 mg via ORAL
  Filled 2014-04-20 (×3): qty 2

## 2014-04-20 NOTE — Progress Notes (Signed)
Patient ID: Monica Ashley Capili, female   DOB: [birth year], 33 y.o.   MRN: [id number]  [facility] PROGRESS NOTE  Monica Ashley Gorr [id number] DOB: [birth year] DOA: 04/19/2014 PCP: Trinna PostKOBERLEIN, JUNELL CAROL, MD  Brief narrative: 33 y.o. 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 Patient reports that her symptoms started on 04/13/2014 when she had a gastroenterology appointment with Dr. Dulce Sellarutlaw, for chronic nausea, epigastric pain, and diarrhea. She indicated that she had a temperature of 101.6 at the clinic visit. She followed up with her rheumatologist on 03/16/2014 because she was having fevers on and off and was having generalized malaise. She indicated that during her clinic visit she had a temperature of 99.1, but was started on empiric ciprofloxacin. Despite being on ciprofloxacin, she reports that she has had on and off episodes of fever at home. In the last 2 days, she indicates that she has had a worsening of her symptoms. Due to her fever and generalized malaise she was tearful earlier today. As a result she presented to the emergency department for further evaluation. In the emergency department she was found to have a temperature of 100.4. Hospitalist service was asked to admit the patient for further care and management. Patient reports she has chronic nausea but has not vomited. Denies any chest pain or shortness of breath. Does complain of chronic lower abdominal pain. Reports having diarrhea almost daily with no blood. Denies any dysuria. Denies any vision changes. Does complain of frontal headache which is different from her migraine headaches. Patient denies any recent travel or sick contacts.   Active Problems:   Fever - unclear etiology, possibly related to UTI - will place on empiric Rocephin  - obtain urine cultures - tylenol as needed   Lupus - continue home medical regimen  - will call Dr. Dareen PianoAnderson pt's  rheumatologist to make him aware pt was admitted    Leukocytosis - possibly secondary to UTI, resolved  - continue Rocephin as noted above    Tachycardia - from UTI and dehydration - in NSR this AM, stable   Consultants:  None  Procedures/Studies: CXR 04/19/2014  No active cardiopulmonary disease.    Antibiotics:  None   Code Status: Full Family Communication: Pt at bedside Disposition Plan: Home when medically stable  HPI/Subjective: No events overnight. Feels weak and tired.   Objective: Filed Vitals:   04/19/14 2245 04/19/14 2252 04/19/14 2354 04/20/14 0538  BP:  122/57 115/71 113/57  Pulse: 94 93 90 81  Temp:   98.3 F (36.8 C) 98.2 F (36.8 C)  TempSrc:   Oral Oral  Resp: 23 17 20 20   Height:   5\' 5"  (1.651 m)   Weight:   117.527 kg (259 lb 1.6 oz) 117.527 kg (259 lb 1.6 oz)  SpO2: 97% 99% 100% 96%    Intake/Output Summary (Last 24 hours) at 04/20/14 1118 Last data filed at 04/20/14 [id number]  Gross per 24 hour  Intake 463.75 ml  Output      0 ml  Net 463.75 ml    Exam:   General:  Pt is alert, follows commands appropriately, not in acute distress  Cardiovascular: Regular rate and rhythm, S1/S2, no murmurs, no rubs, no gallops  Respiratory: Clear to auscultation bilaterally, no wheezing, no crackles, no rhonchi  Abdomen: Soft, non tender, non distended, bowel sounds present, no guarding  Extremities: No edema, pulses DP and PT palpable bilaterally  Neuro: Grossly nonfocal  Data  Reviewed: Basic Metabolic Panel:  Recent Labs Lab 04/19/14 1914 04/20/14 0533  NA 133* 138  K 4.3 4.4  CL 97 103  CO2 23 25  GLUCOSE 112* 104*  BUN 6 8  CREATININE 0.94 0.78  CALCIUM 9.9 10.2   CBC:  Recent Labs Lab 04/19/14 1914 04/20/14 0533  WBC 12.9* 8.9  NEUTROABS 11.8*  --   HGB 12.7 12.1  HCT 39.0 38.3  MCV 85.5 86.5  PLT 313 287   Cardiac Enzymes:   Recent Results (from the past 240 hour(s))  CULTURE, BLOOD (ROUTINE X 2)     Status: None    Collection Time    04/19/14  7:14 PM      Result Value Ref Range Status   Specimen Description BLOOD LEFT ANTECUBITAL   Final   Special Requests BOTTLES DRAWN AEROBIC AND ANAEROBIC 5 ML   Final   Culture  Setup Time     Final   Value: 04/19/2014 22:20     Performed at [facility]   Culture     Final   Value:        BLOOD CULTURE RECEIVED NO GROWTH TO DATE CULTURE WILL BE HELD FOR 5 DAYS BEFORE ISSUING A FINAL NEGATIVE REPORT     Performed at [facility]   Report Status PENDING   Incomplete  CULTURE, BLOOD (ROUTINE X 2)     Status: None   Collection Time    04/19/14  7:14 PM      Result Value Ref Range Status   Specimen Description BLOOD RIGHT FOREARM   Final   Special Requests BOTTLES DRAWN AEROBIC AND ANAEROBIC 5 ML   Final   Culture  Setup Time     Final   Value: 04/19/2014 22:20     Performed at [facility]   Culture     Final   Value:        BLOOD CULTURE RECEIVED NO GROWTH TO DATE CULTURE WILL BE HELD FOR 5 DAYS BEFORE ISSUING A FINAL NEGATIVE REPORT     Performed at [facility]   Report Status PENDING   Incomplete     Scheduled Meds: . aspirin  325 mg Oral Daily  . cholecalciferol  1,000 Units Oral Daily  . enoxaparin (LOVENOX) injection  40 mg Subcutaneous Q24H  . hydroxychloroquine  400 mg Oral Daily  . pantoprazole  40 mg Oral Daily  . predniSONE  30 mg Oral Q breakfast   Continuous Infusions: . sodium chloride 75 mL/hr at 04/20/14 0031   Debbora Presto, MD  [facility] Pager [phone]  If 7PM-7AM, please contact night-coverage [email or web address] Password [email or web address] 04/20/2014, 11:18 AM   LOS: 1 day

## 2014-04-20 NOTE — ED Provider Notes (Signed)
Medical screening examination/treatment/procedure(s) were conducted as a shared visit with non-physician practitioner(s) and myself.  I personally evaluated the patient during the encounter. Pt presents with several days of fever, is immunocompromised due to chronic steroid use.  She has unchanged CP for months, as well as unchanged abdominal pain. She has been on cipro for possible UTI. NO localizing findings on PE or lab work. Cardiopulm exam and abdominal exam benign. Will admit for obs.   EKG Interpretation None        Toy CookeyMegan Matis Monnier, MD 04/20/14 [phone]

## 2014-04-21 LAB — CBC
HCT: 35 % — ABNORMAL LOW (ref 36.0–46.0)
HEMOGLOBIN: 11.2 g/dL — AB (ref 12.0–15.0)
MCH: 27.7 pg (ref 26.0–34.0)
MCHC: 32 g/dL (ref 30.0–36.0)
MCV: 86.4 fL (ref 78.0–100.0)
Platelets: 275 10*3/uL (ref 150–400)
RBC: 4.05 MIL/uL (ref 3.87–5.11)
RDW: 16 % — ABNORMAL HIGH (ref 11.5–15.5)
WBC: 8.2 10*3/uL (ref 4.0–10.5)

## 2014-04-21 LAB — URINE CULTURE: Colony Count: 35000

## 2014-04-21 LAB — COMPREHENSIVE METABOLIC PANEL
ALK PHOS: 32 U/L — AB (ref 39–117)
ALT: 21 U/L (ref 0–35)
AST: 30 U/L (ref 0–37)
Albumin: 2.8 g/dL — ABNORMAL LOW (ref 3.5–5.2)
Anion gap: 14 (ref 5–15)
BILIRUBIN TOTAL: 0.3 mg/dL (ref 0.3–1.2)
BUN: 7 mg/dL (ref 6–23)
CHLORIDE: 101 meq/L (ref 96–112)
CO2: 25 meq/L (ref 19–32)
Calcium: 9.6 mg/dL (ref 8.4–10.5)
Creatinine, Ser: 0.79 mg/dL (ref 0.50–1.10)
GLUCOSE: 90 mg/dL (ref 70–99)
POTASSIUM: 3.6 meq/L — AB (ref 3.7–5.3)
SODIUM: 140 meq/L (ref 137–147)
Total Protein: 6.3 g/dL (ref 6.0–8.3)

## 2014-04-21 MED ORDER — SODIUM CHLORIDE 0.9 % IV SOLN
INTRAVENOUS | Status: AC
  Administered 2014-04-21 – 2014-04-22 (×2): via INTRAVENOUS

## 2014-04-21 MED ORDER — POTASSIUM CHLORIDE CRYS ER 20 MEQ PO TBCR
40.0000 meq | EXTENDED_RELEASE_TABLET | Freq: Once | ORAL | Status: AC
  Administered 2014-04-21: 40 meq via ORAL
  Filled 2014-04-21: qty 2

## 2014-04-21 MED ORDER — DEXTROSE 5 % IV SOLN
500.0000 mg | INTRAVENOUS | Status: DC
  Administered 2014-04-21 – 2014-04-23 (×3): 500 mg via INTRAVENOUS
  Filled 2014-04-21 (×3): qty 500

## 2014-04-21 MED ORDER — KETOROLAC TROMETHAMINE 30 MG/ML IJ SOLN
30.0000 mg | Freq: Four times a day (QID) | INTRAMUSCULAR | Status: DC | PRN
  Administered 2014-04-21 – 2014-04-23 (×3): 30 mg via INTRAVENOUS
  Filled 2014-04-21 (×3): qty 1

## 2014-04-21 MED ORDER — METHYLPREDNISOLONE SODIUM SUCC 40 MG IJ SOLR
40.0000 mg | Freq: Two times a day (BID) | INTRAMUSCULAR | Status: DC
  Administered 2014-04-21 – 2014-04-23 (×5): 40 mg via INTRAVENOUS
  Filled 2014-04-21 (×6): qty 1

## 2014-04-21 NOTE — Progress Notes (Signed)
Pt complaining of heart racing and "going crazy." HR 108 and normal rhythm. EKG done yesterday. Dr. Izola PriceMyers notified.

## 2014-04-21 NOTE — Progress Notes (Signed)
Patient ID: Monica Ashley, female   DOB: [birth year], 33 y.o.   MRN: [id number]  [facility] PROGRESS NOTE  Monica Ashley [id number] DOB: [birth year] DOA: 04/19/2014 PCP: Trinna PostKOBERLEIN, JUNELL CAROL, MD  Brief narrative:  33 y.o. 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 Patient reports that her symptoms started on 04/13/2014 when she had a gastroenterology appointment with Dr. Dulce Sellarutlaw, for chronic nausea, epigastric pain, and diarrhea. She indicated that she had a temperature of 101.6 at the clinic visit. She followed up with her rheumatologist on 03/16/2014 because she was having fevers on and off and was having generalized malaise. She indicated that during her clinic visit she had a temperature of 99.1, but was started on empiric ciprofloxacin. Despite being on ciprofloxacin, she reports that she has had on and off episodes of fever at home. In the last 2 days, she indicates that she has had a worsening of her symptoms. Due to her fever and generalized malaise she was tearful earlier today. As a result she presented to the emergency department for further evaluation. In the emergency department she was found to have a temperature of 100.4. Hospitalist service was asked to admit the patient for further care and management. Patient reports she has chronic nausea but has not vomited. Denies any chest pain or shortness of breath. Does complain of chronic lower abdominal pain. Reports having diarrhea almost daily with no blood. Denies any dysuria. Denies any vision changes. Does complain of frontal headache which is different from her migraine headaches. Patient denies any recent travel or sick contacts.   Active Problems:  Fever  - possibly related to UTI  - continue Rocephin day #2 - follow up on urine culture  - tylenol as needed  Lupus  - continue home medical regimen  - will call Dr. Dareen PianoAnderson pt's rheumatologist to  make him aware pt was admitted  - place on Solumedrol as pt unable to take Prednisone  Hypokalemia - mild, will supplement and repeat BMP in AM Leukocytosis  - possibly secondary to UTI, resolved  - continue Rocephin as noted above  Tachycardia  - from UTI and dehydration  - 2 D ECHO pending  - in NSR this AM, stable  Elevated CK - likely form dehydration - continue IVF and repeat CK level in AM  Consultants:  None  Procedures/Studies:  CXR 04/19/2014 No active cardiopulmonary disease.  Antibiotics:  None   Code Status: Full  Family Communication: Pt at bedside  Disposition Plan: Home when medically stable   HPI/Subjective: No events overnight. Still with malaise.   Objective: Filed Vitals:   04/20/14 0538 04/20/14 2234 04/21/14 0417 04/21/14 0647  BP: 113/57 117/56  129/68  Pulse: 81 84  103  Temp: 98.2 F (36.8 C) 98.2 F (36.8 C) 99.1 F (37.3 C) 98.7 F (37.1 C)  TempSrc: Oral Oral Oral Oral  Resp: 20 16  20   Height:      Weight: 117.527 kg (259 lb 1.6 oz)   107.5 kg (236 lb 15.9 oz)  SpO2: 96% 98%  95%    Intake/Output Summary (Last 24 hours) at 04/21/14 1115 Last data filed at 04/21/14 0646  Gross per 24 hour  Intake   2705 ml  Output      0 ml  Net   2705 ml    Exam:   General:  Pt is alert, follows commands appropriately, not in acute distress  Cardiovascular: Regular rhythm,  tachycardic, S1/S2, no murmurs, no rubs, no gallops  Respiratory: Clear to auscultation bilaterally, no wheezing, no crackles, no rhonchi  Abdomen: Soft, non tender, non distended, bowel sounds present, no guarding  Extremities: No edema, pulses DP and PT palpable bilaterally  Neuro: Grossly nonfocal  Data Reviewed: Basic Metabolic Panel:  Recent Labs Lab 04/19/14 1914 04/20/14 0533 04/21/14 0523  NA 133* 138 140  K 4.3 4.4 3.6*  CL 97 103 101  CO2 23 25 25   GLUCOSE 112* 104* 90  BUN 6 8 7   CREATININE 0.94 0.78 0.79  CALCIUM 9.9 10.2 9.6   Liver Function  Tests:  Recent Labs Lab 04/21/14 0523  AST 30  ALT 21  ALKPHOS 32*  BILITOT 0.3  PROT 6.3  ALBUMIN 2.8*   CBC:  Recent Labs Lab 04/19/14 1914 04/20/14 0533 04/21/14 0523  WBC 12.9* 8.9 8.2  NEUTROABS 11.8*  --   --   HGB 12.7 12.1 11.2*  HCT 39.0 38.3 35.0*  MCV 85.5 86.5 86.4  PLT 313 287 275   Cardiac Enzymes:  Recent Labs Lab 04/19/14 1914  CKTOTAL 510*   Recent Results (from the past 240 hour(s))  CULTURE, BLOOD (ROUTINE X 2)     Status: None   Collection Time    04/19/14  7:14 PM      Result Value Ref Range Status   Specimen Description BLOOD LEFT ANTECUBITAL   Final   Special Requests BOTTLES DRAWN AEROBIC AND ANAEROBIC 5 ML   Final   Culture  Setup Time     Final   Value: 04/19/2014 22:20     Performed at [facility]   Culture     Final   Value:        BLOOD CULTURE RECEIVED NO GROWTH TO DATE CULTURE WILL BE HELD FOR 5 DAYS BEFORE ISSUING A FINAL NEGATIVE REPORT     Performed at [facility]   Report Status PENDING   Incomplete  CULTURE, BLOOD (ROUTINE X 2)     Status: None   Collection Time    04/19/14  7:14 PM      Result Value Ref Range Status   Specimen Description BLOOD RIGHT FOREARM   Final   Special Requests BOTTLES DRAWN AEROBIC AND ANAEROBIC 5 ML   Final   Culture  Setup Time     Final   Value: 04/19/2014 22:20     Performed at [facility]   Culture     Final   Value:        BLOOD CULTURE RECEIVED NO GROWTH TO DATE CULTURE WILL BE HELD FOR 5 DAYS BEFORE ISSUING A FINAL NEGATIVE REPORT     Performed at [facility]   Report Status PENDING   Incomplete  CLOSTRIDIUM DIFFICILE BY PCR     Status: None   Collection Time    04/20/14  9:01 AM      Result Value Ref Range Status   C difficile by pcr NEGATIVE  NEGATIVE Final   Comment: Performed at [facility]     Scheduled Meds: . aspirin  325 mg Oral Daily  . azithromycin  500 mg Intravenous Q24H  . cefTRIAXone (ROCEPHIN)  IV  1 g Intravenous Q1200   . cholecalciferol  1,000 Units Oral Daily  . enoxaparin (LOVENOX) injection  40 mg Subcutaneous Q24H  . hydroxychloroquine  400 mg Oral Daily  . methylPREDNISolone (SOLU-MEDROL) injection  40 mg Intravenous Q12H  . pantoprazole  40 mg  Oral Daily   Continuous Infusions: . sodium chloride 75 mL/hr at 04/21/14 1048     Debbora Presto, MD  [facility] Pager [phone]  If 7PM-7AM, please contact night-coverage [email or web address] Password [email or web address] 04/21/2014, 11:15 AM   LOS: 2 days

## 2014-04-22 DIAGNOSIS — R000 Tachycardia, unspecified: Secondary | ICD-10-CM

## 2014-04-22 DIAGNOSIS — R509 Fever, unspecified: Secondary | ICD-10-CM

## 2014-04-22 LAB — CBC
HCT: 36.5 % (ref 36.0–46.0)
Hemoglobin: 11.4 g/dL — ABNORMAL LOW (ref 12.0–15.0)
MCH: 26.9 pg (ref 26.0–34.0)
MCHC: 31.2 g/dL (ref 30.0–36.0)
MCV: 86.1 fL (ref 78.0–100.0)
PLATELETS: 272 10*3/uL (ref 150–400)
RBC: 4.24 MIL/uL (ref 3.87–5.11)
RDW: 15.8 % — ABNORMAL HIGH (ref 11.5–15.5)
WBC: 8.1 10*3/uL (ref 4.0–10.5)

## 2014-04-22 LAB — BASIC METABOLIC PANEL
ANION GAP: 10 (ref 5–15)
BUN: 7 mg/dL (ref 6–23)
CALCIUM: 9.9 mg/dL (ref 8.4–10.5)
CO2: 26 mEq/L (ref 19–32)
CREATININE: 0.7 mg/dL (ref 0.50–1.10)
Chloride: 103 mEq/L (ref 96–112)
GFR calc Af Amer: >90 mL/min (ref >90)
GFR calc non Af Amer: >90 mL/min (ref >90)
Glucose, Bld: 149 mg/dL — ABNORMAL HIGH (ref 70–99)
Potassium: 4.7 mEq/L (ref 3.7–5.3)
SODIUM: 139 meq/L (ref 137–147)

## 2014-04-22 LAB — CK TOTAL AND CKMB (NOT AT ARMC)
CK, MB: 8.8 ng/mL (ref 0.3–4.0)
Relative Index: 2.8 — ABNORMAL HIGH (ref 0.0–2.5)
Total CK: 319 U/L — ABNORMAL HIGH (ref 7–177)

## 2014-04-22 MED ORDER — SODIUM CHLORIDE 0.9 % IV SOLN
INTRAVENOUS | Status: DC
  Administered 2014-04-22 – 2014-04-23 (×3): via INTRAVENOUS

## 2014-04-22 NOTE — Progress Notes (Signed)
Echo Lab  2D Echocardiogram completed.  Jessamy Torosyan L Dorothea Yow, RDCS 04/22/2014 10:34 AM

## 2014-04-22 NOTE — Progress Notes (Signed)
Patient ID: Monica Ashley, female   DOB: July 11, 1981, 33 y.o.   MRN: [id number]  [facility] PROGRESS NOTE  Monica Ashley [id number]:[id number] DOB: 12-12-1980 DOA: 04/19/2014 PCP: Trinna Post, MD  Brief narrative:  33 y.o. 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 Patient reports that her symptoms started on 04/13/2014 when she had a gastroenterology appointment with Dr. Dulce Sellar, for chronic nausea, epigastric pain, and diarrhea. She indicated that she had a temperature of 101.6 at the clinic visit. She followed up with her rheumatologist on 03/16/2014 because she was having fevers on and off and was having generalized malaise. She indicated that during her clinic visit she had a temperature of 99.1, but was started on empiric ciprofloxacin. Despite being on ciprofloxacin, she reports that she has had on and off episodes of fever at home. In the last 2 days, she indicates that she has had a worsening of her symptoms. Due to her fever and generalized malaise she was tearful earlier today. As a result she presented to the emergency department for further evaluation. In the emergency department she was found to have a temperature of 100.4. Hospitalist service was asked to admit the patient for further care and management. Patient reports she has chronic nausea but has not vomited. Denies any chest pain or shortness of breath. Does complain of chronic lower abdominal pain. Reports having diarrhea almost daily with no blood. Denies any dysuria. Denies any vision changes. Does complain of frontal headache which is different from her migraine headaches. Patient denies any recent travel or sick contacts.   Active Problems:  Fever  - possibly related to UTI  - pt reports feeling better but still weak  - continue Rocephin day #3 - follow up on urine culture  - tylenol as needed  Lupus  - continue home medical regimen  - will  call Dr. Dareen Piano pt's rheumatologist to make him aware pt was admitted  - placed on Solumedrol as pt unable to take Prednisone  - will continue solumedrol today and plan on tapering as clinically indicated  Hypokalemia  - supplemented and WNL this AM Leukocytosis  - possibly secondary to UTI, resolved  - continue Rocephin as noted above  Tachycardia  - from UTI and dehydration  - 2 D ECHO pending  - in NSR this AM, stable  Elevated CK  - likely form dehydration, trending down   - continue IVF and repeat CK level in AM   Consultants:  None  Procedures/Studies:  CXR 04/19/2014 No active cardiopulmonary disease.  Antibiotics:  None   Code Status: Full  Family Communication: Pt at bedside  Disposition Plan: Home when medically stable   HPI/Subjective: No events overnight. Still feels weak with muscle aches.  Objective: Filed Vitals:   04/21/14 2051 04/21/14 2140 04/22/14 0510 04/22/14 0719  BP: 143/82  131/71   Pulse: 86  57   Temp: 99 F (37.2 C) 98.8 F (37.1 C) 98.2 F (36.8 C)   TempSrc: Oral Oral Oral   Resp: 18  18   Height:      Weight:    117.391 kg (258 lb 12.8 oz)  SpO2: 97%  96%     Intake/Output Summary (Last 24 hours) at 04/22/14 1123 Last data filed at 04/22/14 0600  Gross per 24 hour  Intake   2030 ml  Output      0 ml  Net   2030 ml  Exam:   General:  Pt is alert, follows commands appropriately, not in acute distress  Cardiovascular: Regular rate and rhythm, S1/S2, no murmurs, no rubs, no gallops  Respiratory: Clear to auscultation bilaterally, no wheezing, no crackles, no rhonchi  Abdomen: Soft, non tender, non distended, bowel sounds present, no guarding  Extremities: No edema, pulses DP and PT palpable bilaterally  Neuro: Grossly nonfocal  Data Reviewed: Basic Metabolic Panel:  Recent Labs Lab 04/19/14 1914 04/20/14 0533 04/21/14 0523 04/22/14 0500  NA 133* 138 140 139  K 4.3 4.4 3.6* 4.7  CL 97 103 101 103  CO2 23 25 25  26   GLUCOSE 112* 104* 90 149*  BUN 6 8 7 7   CREATININE 0.94 0.78 0.79 0.70  CALCIUM 9.9 10.2 9.6 9.9   Liver Function Tests:  Recent Labs Lab 04/21/14 0523  AST 30  ALT 21  ALKPHOS 32*  BILITOT 0.3  PROT 6.3  ALBUMIN 2.8*   CBC:  Recent Labs Lab 04/19/14 1914 04/20/14 0533 04/21/14 0523 04/22/14 0500  WBC 12.9* 8.9 8.2 8.1  NEUTROABS 11.8*  --   --   --   HGB 12.7 12.1 11.2* 11.4*  HCT 39.0 38.3 35.0* 36.5  MCV 85.5 86.5 86.4 86.1  PLT 313 287 275 272   Cardiac Enzymes:  Recent Labs Lab 04/19/14 1914 04/22/14 0500  CKTOTAL 510* 319*  CKMB  --  8.8*   Recent Results (from the past 240 hour(s))  CULTURE, BLOOD (ROUTINE X 2)     Status: None   Collection Time    04/19/14  7:14 PM      Result Value Ref Range Status   Specimen Description BLOOD LEFT ANTECUBITAL   Final   Special Requests BOTTLES DRAWN AEROBIC AND ANAEROBIC 5 ML   Final   Culture  Setup Time     Final   Value: 04/19/2014 22:20     [facility]   Culture     Final   Value:        BLOOD CULTURE RECEIVED NO GROWTH TO DATE CULTURE WILL BE HELD FOR 5 DAYS BEFORE ISSUING A FINAL NEGATIVE REPORT     [facility]   Report Status PENDING   Incomplete  CULTURE, BLOOD (ROUTINE X 2)     Status: None   Collection Time    04/19/14  7:14 PM      Result Value Ref Range Status   Specimen Description BLOOD RIGHT FOREARM   Final   Special Requests BOTTLES DRAWN AEROBIC AND ANAEROBIC 5 ML   Final   Culture  Setup Time     Final   Value: 04/19/2014 22:20     [facility]   Culture     Final   Value:        BLOOD CULTURE RECEIVED NO GROWTH TO DATE CULTURE WILL BE HELD FOR 5 DAYS BEFORE ISSUING A FINAL NEGATIVE REPORT     [facility]   Report Status PENDING   Incomplete  URINE CULTURE     Status: None   Collection Time    04/19/14  7:38 PM      Result Value Ref Range Status   Specimen Description URINE, RANDOM   Final    Special Requests NONE   Final   Culture  Setup Time     Final   Value: 04/20/2014 18:01     [facility]formed [facility]   Colony  Count     Final   Value: 35,000 COLONIES/ML     Performed at [facility]   Culture     Final   Value: Multiple bacterial morphotypes present, none predominant. Suggest appropriate recollection if clinically indicated.     Performed at [facility]   Report Status 04/21/2014 FINAL   Final  STOOL CULTURE     Status: None   Collection Time    04/20/14  9:01 AM      Result Value Ref Range Status   Specimen Description STOOL   Final   Special Requests NONE   Final   Culture     Final   Value: Culture reincubated for better growth     Performed at [facility]   Report Status PENDING   Incomplete  CLOSTRIDIUM DIFFICILE BY PCR     Status: None   Collection Time    04/20/14  9:01 AM      Result Value Ref Range Status   C difficile by pcr NEGATIVE  NEGATIVE Final   Comment: Performed at [facility]     Scheduled Meds: . aspirin  325 mg Oral Daily  . azithromycin  500 mg Intravenous Q24H  . cefTRIAXone (ROCEPHIN)  IV  1 g Intravenous Q1200  . cholecalciferol  1,000 Units Oral Daily  . enoxaparin (LOVENOX) injection  40 mg Subcutaneous Q24H  . hydroxychloroquine  400 mg Oral Daily  . methylPREDNISolone (SOLU-MEDROL) injection  40 mg Intravenous Q12H  . pantoprazole  40 mg Oral Daily   Continuous Infusions:    Debbora Presto, MD  [facility] Pager [phone]  If 7PM-7AM, please contact night-coverage [email or web address] Password [email or web address] 04/22/2014, 11:23 AM   LOS: 3 days

## 2014-04-23 ENCOUNTER — Inpatient Hospital Stay (HOSPITAL_COMMUNITY): Payer: BC Managed Care – PPO

## 2014-04-23 ENCOUNTER — Ambulatory Visit (HOSPITAL_COMMUNITY): Admission: RE | Admit: 2014-04-23 | Payer: BC Managed Care – PPO | Source: Ambulatory Visit

## 2014-04-23 LAB — BASIC METABOLIC PANEL
Anion gap: 12 (ref 5–15)
BUN: 9 mg/dL (ref 6–23)
CHLORIDE: 103 meq/L (ref 96–112)
CO2: 25 meq/L (ref 19–32)
CREATININE: 0.66 mg/dL (ref 0.50–1.10)
Calcium: 9.8 mg/dL (ref 8.4–10.5)
GFR calc Af Amer: >90 mL/min (ref >90)
GFR calc non Af Amer: >90 mL/min (ref >90)
Glucose, Bld: 151 mg/dL — ABNORMAL HIGH (ref 70–99)
Potassium: 4.6 mEq/L (ref 3.7–5.3)
Sodium: 140 mEq/L (ref 137–147)

## 2014-04-23 LAB — URINALYSIS, ROUTINE W REFLEX MICROSCOPIC
Bilirubin Urine: NEGATIVE
GLUCOSE, UA: NEGATIVE mg/dL
Hgb urine dipstick: NEGATIVE
Ketones, ur: NEGATIVE mg/dL
LEUKOCYTES UA: NEGATIVE
NITRITE: NEGATIVE
PH: 7 (ref 5.0–8.0)
Protein, ur: NEGATIVE mg/dL
SPECIFIC GRAVITY, URINE: 1.022 (ref 1.005–1.030)
Urobilinogen, UA: 0.2 mg/dL (ref 0.0–1.0)

## 2014-04-23 LAB — CBC
HEMATOCRIT: 35.2 % — AB (ref 36.0–46.0)
Hemoglobin: 11.1 g/dL — ABNORMAL LOW (ref 12.0–15.0)
MCH: 27.5 pg (ref 26.0–34.0)
MCHC: 31.5 g/dL (ref 30.0–36.0)
MCV: 87.1 fL (ref 78.0–100.0)
Platelets: 306 10*3/uL (ref 150–400)
RBC: 4.04 MIL/uL (ref 3.87–5.11)
RDW: 15.7 % — ABNORMAL HIGH (ref 11.5–15.5)
WBC: 11.6 10*3/uL — ABNORMAL HIGH (ref 4.0–10.5)

## 2014-04-23 LAB — CK TOTAL AND CKMB (NOT AT ARMC)
CK, MB: 7.7 ng/mL (ref 0.3–4.0)
RELATIVE INDEX: 3.9 — AB (ref 0.0–2.5)
Total CK: 197 U/L — ABNORMAL HIGH (ref 7–177)

## 2014-04-23 MED ORDER — CIPROFLOXACIN HCL 500 MG PO TABS: ORAL_TABLET | ORAL | Status: DC

## 2014-04-23 MED ORDER — PREDNISONE 10 MG PO TABS
30.0000 mg | ORAL_TABLET | Freq: Every day | ORAL | Status: DC
  Filled 2014-04-23: qty 1

## 2014-04-23 MED ORDER — METOCLOPRAMIDE HCL 10 MG PO TABS
10.0000 mg | ORAL_TABLET | Freq: Three times a day (TID) | ORAL | Status: DC
  Administered 2014-04-23: 10 mg via ORAL
  Filled 2014-04-23: qty 1

## 2014-04-23 MED ORDER — SENNOSIDES-DOCUSATE SODIUM 8.6-50 MG PO TABS
1.0000 | ORAL_TABLET | Freq: Two times a day (BID) | ORAL | Status: DC
  Administered 2014-04-23: 1 via ORAL
  Filled 2014-04-23: qty 1

## 2014-04-23 MED ORDER — TECHNETIUM TC 99M SULFUR COLLOID
2.0000 | Freq: Once | INTRAVENOUS | Status: AC | PRN
  Administered 2014-04-23: 2 via INTRAVENOUS

## 2014-04-23 MED ORDER — POLYETHYLENE GLYCOL 3350 17 G PO PACK
17.0000 g | PACK | Freq: Every day | ORAL | Status: DC
  Administered 2014-04-23: 17 g via ORAL
  Filled 2014-04-23: qty 1

## 2014-04-23 MED ORDER — METOCLOPRAMIDE HCL 10 MG PO TABS: 10.0000 mg | ORAL_TABLET | Freq: Three times a day (TID) | ORAL | Status: DC

## 2014-04-23 NOTE — Progress Notes (Signed)
Nutrition Education Note  Wt Readings from Last 15 Encounters:  04/23/14 259 lb 6.4 oz (117.663 kg)    Body mass index is 43.17 kg/(m^2). Patient meets criteria for class III extreme obesity based on current BMI.   Current diet order is regular, patient is consuming approximately 75% of meals at this time. Labs and medications reviewed. Pt reports poor appetite PTA with daily intake consisting of grits/poptart for breakfast and peanut butter sandwiches, cheese quesadilla, and ice cream for other meals. Said she's lost 38 pounds unintentionally since January of this year.   RD consulted for gastroparesis diet education. 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 Used teach back method to educate pt on diet with emphasis on low fiber/low fat foods and small frequent meals. Sample menu and snacks discussed. Pt reports she has been trying to follow bland diet at home. Gastroparesis diet handout provided with RD contact information. Expect good compliance.   Charlott RakesHeather Quentez Lober MS, RD, LDN [phone] Pager [phone] Weekend/After Hours Pager

## 2014-04-23 NOTE — Progress Notes (Signed)
CKMB critical value reported to MD Izola PriceMyers at 0758 this AM. No new orders at this time.

## 2014-04-23 NOTE — Discharge Instructions (Signed)

## 2014-04-23 NOTE — Discharge Summary (Signed)
Physician Discharge Summary  Monica PaganiniJenifer B Ashley EAV:[id number] DOB: 10-Nov-1980 DOA: 04/19/2014  PCP: Trinna PostKOBERLEIN, JUNELL CAROL, MD  Admit date: 04/19/2014 Discharge date: 04/23/2014  Recommendations for Outpatient Follow-up:  1. Pt will need to follow up with PCP in 2-3 weeks post discharge 2. Please obtain BMP to evaluate electrolytes and kidney function 3. Please also check CBC to evaluate Hg and Hct levels 4. Please note that pt was diagnosed with gastroparesis based on gastric emptying study, started on Reglan 5. Pt advised to follow up with GI specialist, she is scheduled for  EGD in AM 6. Please note that Dr. Dareen PianoAnderson with rheumatology, recommending discharging of Prednisone 30 mg tablet daily as per home regimen   Discharge Diagnoses: Fever, UTI Active Problems:   Fever   Lupus   Obesity   Diarrhea   Tachycardia  Discharge Condition: Stable  Diet recommendation: Heart healthy diet discussed in details   Brief narrative:  33 y.o. 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 Patient reports that her symptoms started on 04/13/2014 when she had a gastroenterology appointment with Dr. Dulce Sellarutlaw, for chronic nausea, epigastric pain, and diarrhea. She indicated that she had a temperature of 101.6 at the clinic visit. She followed up with her rheumatologist on 03/16/2014 because she was having fevers on and off and was having generalized malaise. She indicated that during her clinic visit she had a temperature of 99.1, but was started on empiric ciprofloxacin. Despite being on ciprofloxacin, she reports that she has had on and off episodes of fever at home. In the last 2 days, she indicates that she has had a worsening of her symptoms. Due to her fever and generalized malaise she was tearful earlier today. As a result she presented to the emergency department for further evaluation. In the emergency department she was found to have  a temperature of 100.4. Hospitalist service was asked to admit the patient for further care and management. Patient reports she has chronic nausea but has not vomited. Denies any chest pain or shortness of breath. Does complain of chronic lower abdominal pain. Reports having diarrhea almost daily with no blood. Denies any dysuria. Denies any vision changes. Does complain of frontal headache which is different from her migraine headaches. Patient denies any recent travel or sick contacts.   Active Problems:  Fever  - possibly related to UTI  - pt reports feeling better this AM  - continue Rocephin day #5 and transition to oral Cipro upon dishcarge  - tylenol as needed  Sinusitis with bronchitis  - continued Zithromax  - pt reports feeling better  Lupus  - continue home medical regimen  - placed on Solumedrol initially as pt unable to take Prednisone  - transitioned to Prednisone as per home medical regimen  Hypokalemia  - supplemented Leukocytosis  - possibly secondary to UTI  - ABX as noted above  Tachycardia  - from UTI and dehydration  - 2 D ECHO with normal systolic and diastolic function  - in NSR this AM, stable  Elevated CK  - likely form dehydration, trending down  Gastroparesis  - s/p gastric emptying study  - placed in Reglan and will notify primary GI doctor  - pt scheduled for EGD in AM in an outpatient setting by Dr. Dulce Sellarutlaw   Consultants:  None  Procedures/Studies:  CXR 04/19/2014 No active cardiopulmonary disease.  Nm Gastric Emptying 04/23/2014 Abnormal gastric emptying scan with 100% residual at 2  hr.  Antibiotics:  Zithromax 8/8 -->  Rocephin 8/7 -->  Code Status: Full  Family Communication: Pt and husband at bedside   Discharge Exam: Filed Vitals:   04/23/14 0523  BP: 124/78  Pulse: 60  Temp: 98 F (36.7 C)  Resp: 18   Filed Vitals:   04/22/14 2122 04/22/14 2148 04/23/14 0523 04/23/14 0604  BP: 134/65 128/63 124/78   Pulse: 74 76 60   Temp: 98.2  F (36.8 C) 98 F (36.7 C) 98 F (36.7 C)   TempSrc: Oral Oral Oral   Resp: 18 18 18    Height:      Weight:    117.663 kg (259 lb 6.4 oz)  SpO2: 96% 98% 95%     General: Pt is alert, follows commands appropriately, not in acute distress Cardiovascular: Regular rate and rhythm, S1/S2 +, no murmurs, no rubs, no gallops Respiratory: Clear to auscultation bilaterally, no wheezing, no crackles, no rhonchi Abdominal: Soft, non tender, non distended, bowel sounds +, no guarding Extremities: no edema, no cyanosis, pulses palpable bilaterally DP and PT Neuro: Grossly nonfocal  Discharge Instructions  Discharge Instructions   Diet - low sodium heart healthy    Complete by:  As directed      Increase activity slowly    Complete by:  As directed             Medication List         acetaminophen 500 MG tablet  Commonly known as:  TYLENOL  Take 1,000 mg by mouth every 6 (six) hours as needed for mild pain.     aspirin 325 MG tablet  Take 325 mg by mouth daily.     cholecalciferol 1000 UNITS tablet  Commonly known as:  VITAMIN D  Take 1,000 Units by mouth daily.     ciprofloxacin 500 MG tablet  Commonly known as:  CIPRO  Take 500 mg tablet twice daily for 5 more days     diphenhydrAMINE 25 MG tablet  Commonly known as:  BENADRYL  Take 25 mg by mouth at bedtime as needed for sleep.     hydroxychloroquine 200 MG tablet  Commonly known as:  PLAQUENIL  Take 400 mg by mouth daily.     metoCLOPramide 10 MG tablet  Commonly known as:  REGLAN  Take 1 tablet (10 mg total) by mouth 4 (four) times daily -  before meals and at bedtime.     omeprazole 20 MG capsule  Commonly known as:  PRILOSEC  Take 20 mg by mouth daily.     ondansetron 8 MG tablet  Commonly known as:  ZOFRAN  Take 4-8 mg by mouth every 8 (eight) hours as needed for nausea or vomiting.     predniSONE 10 MG tablet  Commonly known as:  DELTASONE  Take 30 mg by mouth daily with breakfast.     traMADol 50 MG  tablet  Commonly known as:  ULTRAM  Take 50-100 mg by mouth 4 (four) times daily as needed for moderate pain.           Follow-up Information   Schedule an appointment as soon as possible for a visit with Trinna Post, MD.   Specialty:  Family Medicine   Contact information:   [address] [place] [address] [phone]       Follow up with Debbora Presto, MD. (As needed, call my cell phone [phone])    Specialty:  Internal Medicine   Contact information:  [address] [place] [address] [phone]        The results of significant diagnostics from this hospitalization (including imaging, microbiology, ancillary and laboratory) are listed below for reference.     Microbiology: Recent Results (from the past 240 hour(s))  CULTURE, BLOOD (ROUTINE X 2)     Status: None   Collection Time    04/19/14  7:14 PM      Result Value Ref Range Status   Specimen Description BLOOD LEFT ANTECUBITAL   Final   Special Requests BOTTLES DRAWN AEROBIC AND ANAEROBIC 5 ML   Final   Culture  Setup Time     Final   Value: 04/19/2014 22:20     Performed at [facility]   Culture     Final   Value:        BLOOD CULTURE RECEIVED NO GROWTH TO DATE CULTURE WILL BE HELD FOR 5 DAYS BEFORE ISSUING A FINAL NEGATIVE REPORT     Performed at [facility]   Report Status PENDING   Incomplete  CULTURE, BLOOD (ROUTINE X 2)     Status: None   Collection Time    04/19/14  7:14 PM      Result Value Ref Range Status   Specimen Description BLOOD RIGHT FOREARM   Final   Special Requests BOTTLES DRAWN AEROBIC AND ANAEROBIC 5 ML   Final   Culture  Setup Time     Final   Value: 04/19/2014 22:20     Performed at [facility]   Culture     Final   Value:        BLOOD CULTURE RECEIVED NO GROWTH TO DATE CULTURE WILL BE HELD FOR 5 DAYS BEFORE ISSUING A FINAL NEGATIVE REPORT     Performed at [facility]   Report Status PENDING   Incomplete   URINE CULTURE     Status: None   Collection Time    04/19/14  7:38 PM      Result Value Ref Range Status   Specimen Description URINE, RANDOM   Final   Special Requests NONE   Final   Culture  Setup Time     Final   Value: 04/20/2014 18:01     Performed at [facility] Count     Final   Value: 35,000 COLONIES/ML     Performed at [facility]   Culture     Final   Value: Multiple bacterial morphotypes present, none predominant. Suggest appropriate recollection if clinically indicated.     Performed at [facility]   Report Status 04/21/2014 FINAL   Final  STOOL CULTURE     Status: None   Collection Time    04/20/14  9:01 AM      Result Value Ref Range Status   Specimen Description STOOL   Final   Special Requests NONE   Final   Culture     Final   Value: NO SUSPICIOUS COLONIES, CONTINUING TO HOLD     Note: REDUCED NORMAL FLORA PRESENT     Performed at [facility]   Report Status PENDING   Incomplete  CLOSTRIDIUM DIFFICILE BY PCR     Status: None   Collection Time    04/20/14  9:01 AM      Result Value Ref Range Status   C difficile by pcr NEGATIVE  NEGATIVE Final   Comment: Performed at [facility]  Labs: Basic Metabolic Panel:  Recent Labs Lab 04/19/14 1914 04/20/14 0533 04/21/14 0523 04/22/14 0500 04/23/14 0525  NA 133* 138 140 139 140  K 4.3 4.4 3.6* 4.7 4.6  CL 97 103 101 103 103  CO2 23 25 25 26 25   GLUCOSE 112* 104* 90 149* 151*  BUN 6 8 7 7 9   CREATININE 0.94 0.78 0.79 0.70 0.66  CALCIUM 9.9 10.2 9.6 9.9 9.8   Liver Function Tests:  Recent Labs Lab 04/21/14 0523  AST 30  ALT 21  ALKPHOS 32*  BILITOT 0.3  PROT 6.3  ALBUMIN 2.8*   No results found for this basename: LIPASE, AMYLASE,  in the last 168 hours No results found for this basename: AMMONIA,  in the last 168 hours CBC:  Recent Labs Lab 04/19/14 1914 04/20/14 0533 04/21/14 0523 04/22/14 0500 04/23/14 0525  WBC 12.9* 8.9 8.2  8.1 11.6*  NEUTROABS 11.8*  --   --   --   --   HGB 12.7 12.1 11.2* 11.4* 11.1*  HCT 39.0 38.3 35.0* 36.5 35.2*  MCV 85.5 86.5 86.4 86.1 87.1  PLT 313 287 275 272 306   Cardiac Enzymes:  Recent Labs Lab 04/19/14 1914 04/22/14 0500 04/23/14 0525  CKTOTAL 510* 319* 197*  CKMB  --  8.8* 7.7*    SIGNED: Time coordinating discharge: Over 30 minutes  Debbora Presto, MD  [facility] 04/23/2014, 11:52 AM Pager [phone]  If 7PM-7AM, please contact night-coverage [email or web address] Password [email or web address]

## 2014-04-23 NOTE — Progress Notes (Signed)
Critical Lab result: CK-MB 7.7; Georgiann CockerKathryn Strup RN notified

## 2014-04-23 NOTE — Progress Notes (Signed)
Pt d/c to home. Follow up appts, reasons to return to ED/MD, prescriptions, and after visit activity discussed with pt. Pt offered no questions. PIV removed after abx completion and meeting with dietician. Pt escorted off of unit in wheelchair to husband's care in main lobby.

## 2014-04-23 NOTE — Progress Notes (Addendum)
Patient ID: Monica Ashley, female   DOB: [birth year], 33 y.o.   MRN: [id number]  [facility] PROGRESS NOTE  Monica Ashley WUJ:[id number] DOB: [birth year] DOA: 04/19/2014 PCP: Trinna Post, MD  Brief narrative: 33 y.o. 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 Patient reports that her symptoms started on 04/13/2014 when she had a gastroenterology appointment with Dr. Dulce Sellar, for chronic nausea, epigastric pain, and diarrhea. She indicated that she had a temperature of 101.6 at the clinic visit. She followed up with her rheumatologist on 03/16/2014 because she was having fevers on and off and was having generalized malaise. She indicated that during her clinic visit she had a temperature of 99.1, but was started on empiric ciprofloxacin. Despite being on ciprofloxacin, she reports that she has had on and off episodes of fever at home. In the last 2 days, she indicates that she has had a worsening of her symptoms. Due to her fever and generalized malaise she was tearful earlier today. As a result she presented to the emergency department for further evaluation. In the emergency department she was found to have a temperature of 100.4. Hospitalist service was asked to admit the patient for further care and management. Patient reports she has chronic nausea but has not vomited. Denies any chest pain or shortness of breath. Does complain of chronic lower abdominal pain. Reports having diarrhea almost daily with no blood. Denies any dysuria. Denies any vision changes. Does complain of frontal headache which is different from her migraine headaches. Patient denies any recent travel or sick contacts.   Active Problems:  Fever  - possibly related to UTI  - pt reports feeling better this AM - continue Rocephin day #4 - tylenol as needed  Sinusitis with bronchitis - continue Zithromax  - pt reports feeling better  Lupus   - continue home medical regimen  - placed on Solumedrol as pt unable to take Prednisone  - will continue solumedrol today and plan on tapering as clinically indicated  Hypokalemia  - supplemented and WNL this AM  Leukocytosis  - possibly secondary to UTI - continue Rocephin as noted above  Tachycardia  - from UTI and dehydration  - 2 D ECHO with normal systolic and diastolic function  - in NSR this AM, stable  Elevated CK  - likely form dehydration, trending down  - continue IVF and repeat CK level in AM  Gastroparesis - s/p gastric emptying study - will place in Reglan and will notify primary GI doctor - pt scheduled for EGD in AM but may need to reschedule until pt discharged   Consultants:  None  Procedures/Studies:  CXR 04/19/2014 No active cardiopulmonary disease.  Nm Gastric Emptying  04/23/2014   Abnormal gastric emptying scan with 100% residual at 2 hr.    Antibiotics:  Zithromax 8/8 --> Rocephin 8/7 -->  Code Status: Full  Family Communication: Pt and husband at bedside  Disposition Plan: Home when medically stable   HPI/Subjective: No events overnight.   Objective: Filed Vitals:   04/22/14 2122 04/22/14 2148 04/23/14 0523 04/23/14 0604  BP: 134/65 128/63 124/78   Pulse: 74 76 60   Temp: 98.2 F (36.8 C) 98 F (36.7 C) 98 F (36.7 C)   TempSrc: Oral Oral Oral   Resp: 18 18 18    Height:      Weight:    117.663 kg (259 lb 6.4 oz)  SpO2: 96% 98% 95%  Intake/Output Summary (Last 24 hours) at 04/23/14 1059 Last data filed at 04/23/14 0600  Gross per 24 hour  Intake   1570 ml  Output      0 ml  Net   1570 ml    Exam:   General:  Pt is alert, follows commands appropriately, not in acute distress  Cardiovascular: Regular rate and rhythm, S1/S2, no murmurs, no rubs, no gallops  Respiratory: Clear to auscultation bilaterally, no wheezing, no crackles, no rhonchi  Abdomen: Soft, mild tenderness in epigastric area, non distended, bowel sounds  present, no guarding  Extremities: No edema, pulses DP and PT palpable bilaterally  Neuro: Grossly nonfocal  Data Reviewed: Basic Metabolic Panel:  Recent Labs Lab 04/19/14 1914 04/20/14 0533 04/21/14 0523 04/22/14 0500 04/23/14 0525  NA 133* 138 140 139 140  K 4.3 4.4 3.6* 4.7 4.6  CL 97 103 101 103 103  CO2 23 25 25 26 25   GLUCOSE 112* 104* 90 149* 151*  BUN 6 8 7 7 9   CREATININE 0.94 0.78 0.79 0.70 0.66  CALCIUM 9.9 10.2 9.6 9.9 9.8   Liver Function Tests:  Recent Labs Lab 04/21/14 0523  AST 30  ALT 21  ALKPHOS 32*  BILITOT 0.3  PROT 6.3  ALBUMIN 2.8*   CBC:  Recent Labs Lab 04/19/14 1914 04/20/14 0533 04/21/14 0523 04/22/14 0500 04/23/14 0525  WBC 12.9* 8.9 8.2 8.1 11.6*  NEUTROABS 11.8*  --   --   --   --   HGB 12.7 12.1 11.2* 11.4* 11.1*  HCT 39.0 38.3 35.0* 36.5 35.2*  MCV 85.5 86.5 86.4 86.1 87.1  PLT 313 287 275 272 306   Cardiac Enzymes:  Recent Labs Lab 04/19/14 1914 04/22/14 0500 04/23/14 0525  CKTOTAL 510* 319* 197*  CKMB  --  8.8* 7.7*   Recent Results (from the past 240 hour(s))  CULTURE, BLOOD (ROUTINE X 2)     Status: None   Collection Time    04/19/14  7:14 PM      Result Value Ref Range Status   Specimen Description BLOOD LEFT ANTECUBITAL   Final   Special Requests BOTTLES DRAWN AEROBIC AND ANAEROBIC 5 ML   Final   Culture  Setup Time     Final   Value: 04/19/2014 22:20     Performed at [facility]   Culture     Final   Value:        BLOOD CULTURE RECEIVED NO GROWTH TO DATE CULTURE WILL BE HELD FOR 5 DAYS BEFORE ISSUING A FINAL NEGATIVE REPORT     Performed at [facility]   Report Status PENDING   Incomplete  CULTURE, BLOOD (ROUTINE X 2)     Status: None   Collection Time    04/19/14  7:14 PM      Result Value Ref Range Status   Specimen Description BLOOD RIGHT FOREARM   Final   Special Requests BOTTLES DRAWN AEROBIC AND ANAEROBIC 5 ML   Final   Culture  Setup Time     Final   Value: 04/19/2014  22:20     Performed at [facility]   Culture     Final   Value:        BLOOD CULTURE RECEIVED NO GROWTH TO DATE CULTURE WILL BE HELD FOR 5 DAYS BEFORE ISSUING A FINAL NEGATIVE REPORT     Performed at [facility]   Report Status PENDING   Incomplete  URINE CULTURE  Status: None   Collection Time    04/19/14  7:38 PM      Result Value Ref Range Status   Specimen Description URINE, RANDOM   Final   Special Requests NONE   Final   Culture  Setup Time     Final   Value: 04/20/2014 18:01     [facility]formed [facility]   Colony Count     Final   Value: 35,000 COLONIES/ML     [facility]   Culture     Final   Value: Multiple bacterial morphotypes present, none predominant. Suggest appropriate recollection if clinically indicated.     [facility]formed at [facility]   Report Status 04/21/2014 FINAL   Final  STOOL CULTURE     Status: None   Collection Time    04/20/14  9:01 AM      Result Value Ref Range Status   Specimen Description STOOL   Final   Special Requests NONE   Final   Culture     Final   Value: NO SUSPICIOUS COLONIES, CONTINUING TO HOLD     Note: REDUCED NORMAL FLORA PRESENT     [facility]   Report Status PENDING   Incomplete  CLOSTRIDIUM DIFFICILE BY PCR     Status: None   Collection Time    04/20/14  9:01 AM      Result Value Ref Range Status   C difficile by pcr NEGATIVE  NEGATIVE Final   Comment: [facility]     Scheduled Meds: . aspirin  325 mg Oral Daily  . azithromycin  500 mg Intravenous Q24H  . cefTRIAXone  IV  1 g Intravenous Q1200  . cholecalciferol  1,000 Units Oral Daily  . enoxaparin injection  40 mg Subcutaneous Q24H  . hydroxychloroquine  400 mg Oral Daily  . methylPREDNISolone  inj  40 mg Intravenous Q12H  . pantoprazole  40 mg Oral Daily  . polyethylene glycol  17 g Oral Daily  . senna-docusate  1 tablet Oral BID   Continuous Infusions: . sodium  chloride 75 mL/hr at 04/23/14 1050   Debbora PrestoMAGICK-Haeli Gerlich, MD  [facility] Pager [phone]  If 7PM-7AM, please contact night-coverage [email or web address] Password [email or web address] 04/23/2014, 10:59 AM   LOS: 4 days

## 2014-04-24 LAB — URINE CULTURE
COLONY COUNT: NO GROWTH
CULTURE: NO GROWTH

## 2014-04-24 LAB — STOOL CULTURE

## 2014-04-25 LAB — CULTURE, BLOOD (ROUTINE X 2)
Culture: NO GROWTH
Culture: NO GROWTH

## 2014-11-06 ENCOUNTER — Ambulatory Visit (HOSPITAL_COMMUNITY)
Admission: RE | Admit: 2014-11-06 | Discharge: 2014-11-06 | Disposition: A | Discharge status: Home / Self Care | Payer: BLUE CROSS/BLUE SHIELD | Source: Ambulatory Visit | Attending: Family Medicine | Admitting: Family Medicine

## 2014-11-06 ENCOUNTER — Other Ambulatory Visit (HOSPITAL_COMMUNITY): Payer: Self-pay | Admitting: Family Medicine

## 2014-11-06 DIAGNOSIS — R1031 Right lower quadrant pain: Secondary | ICD-10-CM

## 2014-11-06 DIAGNOSIS — K3184 Gastroparesis: Secondary | ICD-10-CM | POA: Diagnosis not present

## 2014-11-06 DIAGNOSIS — M329 Systemic lupus erythematosus, unspecified: Secondary | ICD-10-CM | POA: Diagnosis not present

## 2014-11-06 MED ORDER — IOHEXOL 300 MG/ML  SOLN
100.0000 mL | Freq: Once | INTRAMUSCULAR | Status: AC | PRN
  Administered 2014-11-06: 100 mL via INTRAVENOUS

## 2015-06-24 ENCOUNTER — Emergency Department (HOSPITAL_COMMUNITY): Payer: BLUE CROSS/BLUE SHIELD

## 2015-06-24 ENCOUNTER — Emergency Department (HOSPITAL_COMMUNITY)
Admission: EM | Admit: 2015-06-24 | Discharge: 2015-06-24 | Disposition: A | Discharge status: Home / Self Care | Payer: BLUE CROSS/BLUE SHIELD | Attending: Emergency Medicine | Admitting: Emergency Medicine

## 2015-06-24 ENCOUNTER — Encounter (HOSPITAL_COMMUNITY): Payer: Self-pay | Admitting: Emergency Medicine

## 2015-06-24 DIAGNOSIS — R1013 Epigastric pain: Secondary | ICD-10-CM | POA: Insufficient documentation

## 2015-06-24 DIAGNOSIS — Z3202 Encounter for pregnancy test, result negative: Secondary | ICD-10-CM | POA: Insufficient documentation

## 2015-06-24 DIAGNOSIS — J219 Acute bronchiolitis, unspecified: Secondary | ICD-10-CM | POA: Diagnosis not present

## 2015-06-24 DIAGNOSIS — K920 Hematemesis: Secondary | ICD-10-CM | POA: Insufficient documentation

## 2015-06-24 DIAGNOSIS — Z8739 Personal history of other diseases of the musculoskeletal system and connective tissue: Secondary | ICD-10-CM | POA: Diagnosis not present

## 2015-06-24 DIAGNOSIS — Z79899 Other long term (current) drug therapy: Secondary | ICD-10-CM | POA: Diagnosis not present

## 2015-06-24 HISTORY — DX: Gastroparesis: K31.84

## 2015-06-24 LAB — COMPREHENSIVE METABOLIC PANEL
ALT: 42 U/L (ref 14–54)
AST: 77 U/L — AB (ref 15–41)
Albumin: 3.9 g/dL (ref 3.5–5.0)
Alkaline Phosphatase: 48 U/L (ref 38–126)
Anion gap: 11 (ref 5–15)
BILIRUBIN TOTAL: 0.6 mg/dL (ref 0.3–1.2)
BUN: 8 mg/dL (ref 6–20)
CHLORIDE: 101 mmol/L (ref 101–111)
CO2: 25 mmol/L (ref 22–32)
CREATININE: 0.68 mg/dL (ref 0.44–1.00)
Calcium: 10 mg/dL (ref 8.9–10.3)
GFR calc Af Amer: >60 mL/min (ref >60)
Glucose, Bld: 110 mg/dL — ABNORMAL HIGH (ref 65–99)
POTASSIUM: 3.5 mmol/L (ref 3.5–5.1)
Sodium: 137 mmol/L (ref 135–145)
TOTAL PROTEIN: 7.6 g/dL (ref 6.5–8.1)

## 2015-06-24 LAB — URINALYSIS, ROUTINE W REFLEX MICROSCOPIC
BILIRUBIN URINE: NEGATIVE
GLUCOSE, UA: NEGATIVE mg/dL
Hgb urine dipstick: NEGATIVE
Ketones, ur: NEGATIVE mg/dL
NITRITE: NEGATIVE
PH: 7 (ref 5.0–8.0)
PROTEIN: NEGATIVE mg/dL
Specific Gravity, Urine: 1.006 (ref 1.005–1.030)
Urobilinogen, UA: 0.2 mg/dL (ref 0.0–1.0)

## 2015-06-24 LAB — CBC WITH DIFFERENTIAL/PLATELET
Basophils Absolute: 0 10*3/uL (ref 0.0–0.1)
Basophils Relative: 0 %
EOS PCT: 2 %
Eosinophils Absolute: 0.1 10*3/uL (ref 0.0–0.7)
HCT: 40.3 % (ref 36.0–46.0)
Hemoglobin: 12.8 g/dL (ref 12.0–15.0)
LYMPHS ABS: 1.4 10*3/uL (ref 0.7–4.0)
LYMPHS PCT: 24 %
MCH: 28.2 pg (ref 26.0–34.0)
MCHC: 31.8 g/dL (ref 30.0–36.0)
MCV: 88.8 fL (ref 78.0–100.0)
MONO ABS: 0.7 10*3/uL (ref 0.1–1.0)
MONOS PCT: 13 %
NEUTROS ABS: 3.4 10*3/uL (ref 1.7–7.7)
Neutrophils Relative %: 61 %
PLATELETS: 325 10*3/uL (ref 150–400)
RBC: 4.54 MIL/uL (ref 3.87–5.11)
RDW: 15.8 % — AB (ref 11.5–15.5)
WBC: 5.6 10*3/uL (ref 4.0–10.5)

## 2015-06-24 LAB — LIPASE, BLOOD: Lipase: 37 U/L (ref 22–51)

## 2015-06-24 LAB — URINE MICROSCOPIC-ADD ON

## 2015-06-24 LAB — POC URINE PREG, ED: Preg Test, Ur: NEGATIVE

## 2015-06-24 MED ORDER — PREDNISONE 10 MG (21) PO TBPK: 10.0000 mg | ORAL_TABLET | Freq: Every day | ORAL | Status: DC

## 2015-06-24 MED ORDER — IOHEXOL 300 MG/ML  SOLN
50.0000 mL | Freq: Once | INTRAMUSCULAR | Status: DC | PRN
  Administered 2015-06-24: 50 mL via ORAL
  Filled 2015-06-24: qty 50

## 2015-06-24 MED ORDER — PROMETHAZINE HCL 25 MG/ML IJ SOLN
25.0000 mg | Freq: Once | INTRAMUSCULAR | Status: AC
  Administered 2015-06-24: 25 mg via INTRAVENOUS
  Filled 2015-06-24: qty 1

## 2015-06-24 MED ORDER — MORPHINE SULFATE (PF) 4 MG/ML IV SOLN
4.0000 mg | Freq: Once | INTRAVENOUS | Status: AC
  Administered 2015-06-24: 4 mg via INTRAVENOUS
  Filled 2015-06-24: qty 1

## 2015-06-24 MED ORDER — SODIUM CHLORIDE 0.9 % IV SOLN
Freq: Once | INTRAVENOUS | Status: AC
  Administered 2015-06-24: 06:00:00 via INTRAVENOUS

## 2015-06-24 MED ORDER — ALBUTEROL SULFATE HFA 108 (90 BASE) MCG/ACT IN AERS: 1.0000 | INHALATION_SPRAY | Freq: Four times a day (QID) | RESPIRATORY_TRACT | Status: AC | PRN

## 2015-06-24 MED ORDER — IOHEXOL 300 MG/ML  SOLN
100.0000 mL | Freq: Once | INTRAMUSCULAR | Status: AC | PRN
  Administered 2015-06-24: 100 mL via INTRAVENOUS

## 2015-06-24 MED ORDER — SODIUM CHLORIDE 0.9 % IV BOLUS (SEPSIS)
1000.0000 mL | Freq: Once | INTRAVENOUS | Status: AC
  Administered 2015-06-24: 1000 mL via INTRAVENOUS

## 2015-06-24 NOTE — ED Provider Notes (Signed)
CSN: [id number]     Arrival date & time 06/24/15  0434 History   First MD Initiated Contact with Patient 06/24/15 [phone]     Chief Complaint  Patient presents with  . Hematemesis     (Consider location/radiation/quality/duration/timing/severity/associated sxs/prior Treatment) HPI Comments: This is a 34 year old female with a history of lupus and GERD who frequently vomits throughout the day.  States that when she went to bed laceration a terrible taste in her mouth, she was able to sleep.  For a period of  Time but was awakened at 2:30 in the morning with nausea and proceeded to vomit 1.  It appeared to have some old dark, brown blood in it.  She has since vomited 2 more times with right red blood.  She has been unable to determine quantity but does report epigastric pain and continued nausea  The history is provided by the patient.    Past Medical History  Diagnosis Date  . Lupus (HCC)   . Gastroparesis    Past Surgical History  Procedure Laterality Date  . Cholecystectomy    . Cesarean section    . Tonsillectomy and adenoidectomy     Family History  Problem Relation Age of Onset  . Gout Mother   . Hypertension Mother   . Gout Father    Social History  Substance Use Topics  . Smoking status: Never Smoker   . Smokeless tobacco: None  . Alcohol Use: No   OB History    No data available     Review of Systems  Constitutional: Negative for fever and chills.  Respiratory: Negative for shortness of breath.   Cardiovascular: Negative for chest pain.  Gastrointestinal: Positive for nausea, vomiting and abdominal pain. Negative for diarrhea, constipation, blood in stool and abdominal distention.       Blood in emesis X2 from old brown blood in emesis #1 to bright red blood in subsequent episodes   Skin: Negative for rash.  All other systems reviewed and are negative.     Allergies  Codeine  Home Medications   Prior to Admission medications   Medication Sig Start  Date End Date Taking? Authorizing Provider  amLODipine (NORVASC) 5 MG tablet Take 5 mg by mouth daily.   Yes Historical Provider, MD  ciprofloxacin (CIPRO) 500 MG tablet Take 500 mg tablet twice daily for 5 more days 04/23/14  Yes Dorothea Ogle, MD  dexlansoprazole (DEXILANT) 60 MG capsule Take 60 mg by mouth daily.   Yes Historical Provider, MD  diazepam (VALIUM) 5 MG tablet Take 5 mg by mouth at bedtime.    Yes Historical Provider, MD  folic acid (FOLVITE) 1 MG tablet Take 1 mg by mouth daily.   Yes Historical Provider, MD  gabapentin (NEURONTIN) 300 MG capsule Take 300 mg by mouth 2 (two) times daily.   Yes Historical Provider, MD  hydroxychloroquine (PLAQUENIL) 200 MG tablet Take 400 mg by mouth daily.   Yes Historical Provider, MD  methotrexate 50 MG/2ML injection Inject 50 mg/m2 into the vein once a week.   Yes Historical Provider, MD  predniSONE (DELTASONE) 5 MG tablet Take 10 mg by mouth daily with breakfast.   Yes Historical Provider, MD  promethazine (PHENERGAN) 25 MG tablet Take 25 mg by mouth every 6 (six) hours as needed for nausea or vomiting.   Yes Historical Provider, MD  acetaminophen (TYLENOL) 500 MG tablet Take 1,000 mg by mouth every 6 (six) hours as needed for mild pain.  Historical Provider, MD  albuterol (PROVENTIL HFA;VENTOLIN HFA) 108 (90 BASE) MCG/ACT inhaler Inhale 1-2 puffs into the lungs every 6 (six) hours as needed for wheezing or shortness of breath. 06/24/15   Trixie Dredge, PA-C  metoCLOPramide (REGLAN) 10 MG tablet Take 1 tablet (10 mg total) by mouth 4 (four) times daily -  before meals and at bedtime. Patient not taking: Reported on 06/24/2015 04/23/14   Dorothea Ogle, MD  predniSONE (STERAPRED UNI-PAK 21 TAB) 10 MG (21) TBPK tablet Take 1 tablet (10 mg total) by mouth daily. Day 1: take 6 tabs.  Day 2: 5 tabs  Day 3: 4 tabs  Day 4: 3 tabs  Day 5: 2 tabs  Day 6: 1 tab 06/24/15   Trixie Dredge, PA-C   BP 101/58 mmHg  Pulse 85  Temp(Src) 98.1 F (36.7 C) (Oral)  Resp  18  Ht  (1.651 m)  Wt 260 lb (117.935 kg)  BMI 43.27 kg/m2  SpO2 96%  LMP  Physical Exam  Constitutional: She is oriented to person, place, and time. She appears well-developed and well-nourished.  HENT:  Head: Normocephalic.  Mouth/Throat: Oropharynx is clear and moist.  Eyes: Pupils are equal, round, and reactive to light.  Neck: Normal range of motion.  Cardiovascular: Normal rate and regular rhythm.   Pulmonary/Chest: Effort normal and breath sounds normal.  Abdominal: Soft. She exhibits no distension. There is tenderness in the epigastric area. There is no rigidity and no guarding.  Musculoskeletal: Normal range of motion.  Neurological: She is alert and oriented to person, place, and time.  Skin: Skin is warm. No pallor.  Nursing note and vitals reviewed.   ED Course  Procedures (including critical care time) Labs Review Labs Reviewed  CBC WITH DIFFERENTIAL/PLATELET - Abnormal; Notable for the following:    RDW 15.8 (*)    All other components within normal limits  COMPREHENSIVE METABOLIC PANEL - Abnormal; Notable for the following:    Glucose, Bld 110 (*)    AST 77 (*)    All other components within normal limits  URINALYSIS, ROUTINE W REFLEX MICROSCOPIC (NOT AT [facility]) - Abnormal; Notable for the following:    APPearance CLOUDY (*)    Leukocytes, UA SMALL (*)    All other components within normal limits  LIPASE, BLOOD  H. PYLORI ANTIBODY, IGG  URINE MICROSCOPIC-ADD ON  POC URINE PREG, ED    Imaging Review No results found. I have personally reviewed and evaluated these images and lab results as part of my medical decision-making.   EKG Interpretation None      MDM   Final diagnoses:  Epigastric pain  Hematemesis with nausea  Bronchiolitis         Earley Favor, NP 06/26/15 1958  April Palumbo, MD 07/01/15 [phone]

## 2015-06-24 NOTE — ED Notes (Signed)
EDPA EMILY  at bedside. 

## 2015-06-24 NOTE — ED Provider Notes (Signed)
6:08 AM Patient signed out to me by Earley Favor, NP, at change of shift.  Pt with hx lupus, gastroparesis with daily vomiting p/w hematemesis and epigastric pain that began overnight.  Plan is for IVF, phenergan, labs.  Anticipate d/c home with GI follow up - has appt scheduled in 2 weeks.    8:01 AM Pt reports she continues to have metallic taste in mouth and epigastric pain but denies further vomiting. Pt is concerned because the pain is so different today than usual.  Also notes she has been having intermittent fevers x 1 month, has not been feeling well, has had myalgias and not feeling right for the past few days.  Throughout the month she has been treated for respiratory infection, UTI, ear infection, still taking antibiotic for UTI but states she is overall not feeling better. Normal BM yesterday.  Denies any abnormal vaginal discharge or bleeding.  Does not have regular periods x years.  On exam, abdomen is soft, nondistended, TTP epigastric and throughout right abdomen, no guarding, or rebound.   Discussed options with patient and her significant other at length - they are concerned about rupture within the GI tract which I strongly doubt given patient's exam.  However, pt is tender throughout right side including over McBurney's point.  Pt is s/p cholecystectomy and c-sections, no other abdominal surgeries.  Engaged in joint decision making with patient and significant other and they prefer to undergo CT scan to r/o other intraabdominal process more than her baseline gastroparesis causing these symptoms.  Discussed the risks and benefits including radiation exposure with patient and significant other.    11:32 AM Abdominal exam remains benign.  Discussed results with patient and spouse.  Pt has residual change in left lower lung c/w bronchiolitis.  Pt reports improving but persistent cough.  Will add albuterol and short course of steroids.  Advised close follow up with GI.  Pt has nausea medications  at home.  She had no further episodes of vomiting in ED.  Never had any blood per rectum.    Results for orders placed or performed during the hospital encounter of 06/24/15  CBC with Differential  Result Value Ref Range   WBC 5.6 4.0 - 10.5 K/uL   RBC 4.54 3.87 - 5.11 MIL/uL   Hemoglobin 12.8 12.0 - 15.0 g/dL   HCT 40.9 81.1 - 91.4 %   MCV 88.8 78.0 - 100.0 fL   MCH 28.2 26.0 - 34.0 pg   MCHC 31.8 30.0 - 36.0 g/dL   RDW 78.2 (H) 95.6 - 21.3 %   Platelets 325 150 - 400 K/uL   Neutrophils Relative % 61 %   Neutro Abs 3.4 1.7 - 7.7 K/uL   Lymphocytes Relative 24 %   Lymphs Abs 1.4 0.7 - 4.0 K/uL   Monocytes Relative 13 %   Monocytes Absolute 0.7 0.1 - 1.0 K/uL   Eosinophils Relative 2 %   Eosinophils Absolute 0.1 0.0 - 0.7 K/uL   Basophils Relative 0 %   Basophils Absolute 0.0 0.0 - 0.1 K/uL  Comprehensive metabolic panel  Result Value Ref Range   Sodium 137 135 - 145 mmol/L   Potassium 3.5 3.5 - 5.1 mmol/L   Chloride 101 101 - 111 mmol/L   CO2 25 22 - 32 mmol/L   Glucose, Bld 110 (H) 65 - 99 mg/dL   BUN 8 6 - 20 mg/dL   Creatinine, Ser 0.86 0.44 - 1.00 mg/dL   Calcium 57.8 8.9 - 10.3  mg/dL   Total Protein 7.6 6.5 - 8.1 g/dL   Albumin 3.9 3.5 - 5.0 g/dL   AST 77 (H) 15 - 41 U/L   ALT 42 14 - 54 U/L   Alkaline Phosphatase 48 38 - 126 U/L   Total Bilirubin 0.6 0.3 - 1.2 mg/dL   GFR calc non Af Amer >60 >60 mL/min   GFR calc Af Amer >60 >60 mL/min   Anion gap 11 5 - 15  Lipase, blood  Result Value Ref Range   Lipase 37 22 - 51 U/L  Urinalysis, Routine w reflex microscopic  Result Value Ref Range   Color, Urine YELLOW YELLOW   APPearance CLOUDY (A) CLEAR   Specific Gravity, Urine 1.006 1.005 - 1.030   pH 7.0 5.0 - 8.0   Glucose, UA NEGATIVE NEGATIVE mg/dL   Hgb urine dipstick NEGATIVE NEGATIVE   Bilirubin Urine NEGATIVE NEGATIVE   Ketones, ur NEGATIVE NEGATIVE mg/dL   Protein, ur NEGATIVE NEGATIVE mg/dL   Urobilinogen, UA 0.2 0.0 - 1.0 mg/dL   Nitrite NEGATIVE  NEGATIVE   Leukocytes, UA SMALL (A) NEGATIVE  Urine microscopic-add on  Result Value Ref Range   Squamous Epithelial / LPF RARE RARE   WBC, UA 3-6 <3 WBC/hpf   Bacteria, UA RARE RARE  POC urine preg, ED  Result Value Ref Range   Preg Test, Ur NEGATIVE NEGATIVE   Ct Abdomen Pelvis W Contrast  06/24/2015   CLINICAL DATA:  Hematemesis.  Epigastric pain.  Gastroparesis.  EXAM: CT ABDOMEN AND PELVIS WITH CONTRAST  TECHNIQUE: Multidetector CT imaging of the abdomen and pelvis was performed using the standard protocol following bolus administration of intravenous contrast.  CONTRAST:  OMNIPAQUE IOHEXOL 300 MG/ML  SOLN  COMPARISON:  11/06/2014  FINDINGS: Lower chest: Clustered tree-in-bud opacity in the left lower lobe favoring atypical infectious bronchiolitis, new compared to the February exam.  Contrast medium is present in the distal esophagus and could reflect dysmotility or reflux. I do not see an obvious irregularity or mass in the distal esophagus. No extraluminal gas or extraluminal contrast along the distal esophagus.  Hepatobiliary: Cholecystectomy  Pancreas: Unremarkable  Spleen: Unremarkable  Adrenals/Urinary Tract: Non rotated and somewhat flattened and inferiorly positioned left kidney. No current hydronephrosis or hydroureter.  Stomach/Bowel: No observed gastric mass or extraluminal contrast. Appendix normal. Scattered colonic diverticula. Sigmoid colon diverticulosis without overt diverticulitis.  Vascular/Lymphatic: 8 mm left common iliac lymph node, image 54 series 2, in the upper normal size range.  Reproductive: Unremarkable  Other: No supplemental non-categorized findings.  Musculoskeletal: Sclerosis along the iliac sides of both sacroiliac joints, relatively symmetric. Mild lower lumbar degenerative disc disease with borderline right foraminal stenosis at L4-5 due to disc bulge, facet arthropathy, and intervertebral spurring.  IMPRESSION: 1. A specific cause for hematemesis is not  identified, although there is contrast in the distal esophagus indicating reflux or dysmotility. 2. 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 3. Atypical infectious bronchiolitis in the left lower lobe. 4. Osteitis condensans ilii. 5. Mild lower lumbar spondylosis and degenerative disc disease causing borderline right foraminal stenosis at L4-5.   Electronically Signed   By: Gaylyn Rong M.D.   On: 06/24/2015 10:55      Trixie Dredge, PA-C 06/24/15 1534  April Palumbo, MD 06/25/15 [phone]

## 2015-06-24 NOTE — ED Notes (Signed)
EDPA EMILY PRESENT at bedside. PT STATES SHE HAS HAD NO MORE VOMITING

## 2015-06-24 NOTE — ED Notes (Addendum)
Pt resting without complaint. CT to bring oral contrast

## 2015-06-24 NOTE — ED Notes (Signed)
Pt states she awoke @ 0230 and had episodes of dark then bright red emesis after severe epigastric pain. Pt states she has gastroparesis and has emesis often but has never noted blood in vomit

## 2015-06-24 NOTE — ED Notes (Signed)
Pt with BP 92/60 lying and 98/65 sitting- Pt states she felt dizzy while standing. Pt concerned about going home then having to return. Ginger-ale given. Husband went to cafe to get her something to eat. Attempting to find EDPA to update.

## 2015-06-24 NOTE — ED Notes (Signed)
Patient is resting comfortably. 

## 2015-06-24 NOTE — ED Notes (Signed)
Patient transported to CT 

## 2015-06-24 NOTE — Discharge Instructions (Signed)
Read the information below.  Use the prescribed medication as directed.  Please discuss all new medications with your pharmacist.  You may return to the Emergency Department at any time for worsening condition or any new symptoms that concern you.   If you develop high fevers, worsening abdominal pain, uncontrolled vomiting, significant amount of blood in your vomit or in your stool, or are unable to tolerate fluids by mouth, return to the ER for a recheck.     Abdominal Pain, Adult Many things can cause abdominal pain. Usually, abdominal pain is not caused by a disease and will improve without treatment. It can often be observed and treated at home. Your health care provider will do a physical exam and possibly order blood tests and X-rays to help determine the seriousness of your pain. However, in many cases, more time must pass before a clear cause of the pain can be found. Before that point, your health care provider may not know if you need more testing or further treatment. HOME CARE INSTRUCTIONS Monitor your abdominal pain for any changes. The following actions may help to alleviate any discomfort you are experiencing:  Only take over-the-counter or prescription medicines as directed by your health care provider.  Do not take laxatives unless directed to do so by your health care provider.  Try a clear liquid diet (broth, tea, or water) as directed by your health care provider. Slowly move to a bland diet as tolerated. SEEK MEDICAL CARE IF:  You have unexplained abdominal pain.  You have abdominal pain associated with nausea or diarrhea.  You have pain when you urinate or have a bowel movement.  You experience abdominal pain that wakes you in the night.  You have abdominal pain that is worsened or improved by eating food.  You have abdominal pain that is worsened with eating fatty foods.  You have a fever. SEEK IMMEDIATE MEDICAL CARE IF:  Your pain does not go away within 2  hours.  You keep throwing up (vomiting).  Your pain is felt only in portions of the abdomen, such as the right side or the left lower portion of the abdomen.  You pass bloody or black tarry stools. MAKE SURE YOU:  Understand these instructions.  Will watch your condition.  Will get help right away if you are not doing well or get worse.   This information is not intended to replace advice given to you by your health care provider. Make sure you discuss any questions you have with your health care provider.   Document Released: 06/10/2005 Document Revised: 05/22/2015 Document Reviewed: 05/10/2013 Elsevier Interactive Patient Education 2016 [employer]  Nausea and Vomiting Nausea is a sick feeling that often comes before throwing up (vomiting). Vomiting is a reflex where stomach contents come out of your mouth. Vomiting can cause severe loss of body fluids (dehydration). Children and elderly adults can become dehydrated quickly, especially if they also have diarrhea. Nausea and vomiting are symptoms of a condition or disease. It is important to find the cause of your symptoms. CAUSES   Direct irritation of the stomach lining. This irritation can result from increased acid production (gastroesophageal reflux disease), infection, food poisoning, taking certain medicines (such as nonsteroidal anti-inflammatory drugs), alcohol use, or tobacco use.  Signals from the brain.These signals could be caused by a headache, heat exposure, an inner ear disturbance, increased pressure in the brain from injury, infection, a tumor, or a concussion, pain, emotional stimulus, or metabolic problems.  An  obstruction in the gastrointestinal tract (bowel obstruction).  Illnesses such as diabetes, hepatitis, gallbladder problems, appendicitis, kidney problems, cancer, sepsis, atypical symptoms of a heart attack, or eating disorders.  Medical treatments such as chemotherapy and radiation.  Receiving  medicine that makes you sleep (general anesthetic) during surgery. DIAGNOSIS Your caregiver may ask for tests to be done if the problems do not improve after a few days. Tests may also be done if symptoms are severe or if the reason for the nausea and vomiting is not clear. Tests may include:  Urine tests.  Blood tests.  Stool tests.  Cultures (to look for evidence of infection).  X-rays or other imaging studies. Test results can help your caregiver make decisions about treatment or the need for additional tests. TREATMENT You need to stay well hydrated. Drink frequently but in small amounts.You may wish to drink water, sports drinks, clear broth, or eat frozen ice pops or gelatin dessert to help stay hydrated.When you eat, eating slowly may help prevent nausea.There are also some antinausea medicines that may help prevent nausea. HOME CARE INSTRUCTIONS   Take all medicine as directed by your caregiver.  If you do not have an appetite, do not force yourself to eat. However, you must continue to drink fluids.  If you have an appetite, eat a normal diet unless your caregiver tells you differently.  Eat a variety of complex carbohydrates (rice, wheat, potatoes, bread), lean meats, yogurt, fruits, and vegetables.  Avoid high-fat foods because they are more difficult to digest.  Drink enough water and fluids to keep your urine clear or pale yellow.  If you are dehydrated, ask your caregiver for specific rehydration instructions. Signs of dehydration may include:  Severe thirst.  Dry lips and mouth.  Dizziness.  Dark urine.  Decreasing urine frequency and amount.  Confusion.  Rapid breathing or pulse. SEEK IMMEDIATE MEDICAL CARE IF:   You have blood or brown flecks (like coffee grounds) in your vomit.  You have black or bloody stools.  You have a severe headache or stiff neck.  You are confused.  You have severe abdominal pain.  You have chest pain or trouble  breathing.  You do not urinate at least once every 8 hours.  You develop cold or clammy skin.  You continue to vomit for longer than 24 to 48 hours.  You have a fever. MAKE SURE YOU:   Understand these instructions.  Will watch your condition.  Will get help right away if you are not doing well or get worse.   This information is not intended to replace advice given to you by your health care provider. Make sure you discuss any questions you have with your health care provider.   Document Released: 08/31/2005 Document Revised: 11/23/2011 Document Reviewed: 01/28/2011 Elsevier Interactive Patient Education [employer]

## 2015-06-25 LAB — H. PYLORI ANTIBODY, IGG

## 2015-09-17 ENCOUNTER — Emergency Department (HOSPITAL_COMMUNITY): Payer: BLUE CROSS/BLUE SHIELD

## 2015-09-17 ENCOUNTER — Encounter (HOSPITAL_COMMUNITY): Payer: Self-pay | Admitting: Emergency Medicine

## 2015-09-17 ENCOUNTER — Emergency Department (HOSPITAL_COMMUNITY)
Admission: EM | Admit: 2015-09-17 | Discharge: 2015-09-17 | Disposition: A | Discharge status: Home / Self Care | Payer: BLUE CROSS/BLUE SHIELD | Attending: Emergency Medicine | Admitting: Emergency Medicine

## 2015-09-17 DIAGNOSIS — Z8719 Personal history of other diseases of the digestive system: Secondary | ICD-10-CM | POA: Diagnosis not present

## 2015-09-17 DIAGNOSIS — R197 Diarrhea, unspecified: Secondary | ICD-10-CM | POA: Insufficient documentation

## 2015-09-17 DIAGNOSIS — Z7952 Long term (current) use of systemic steroids: Secondary | ICD-10-CM | POA: Diagnosis not present

## 2015-09-17 DIAGNOSIS — M25421 Effusion, right elbow: Secondary | ICD-10-CM

## 2015-09-17 DIAGNOSIS — M25521 Pain in right elbow: Secondary | ICD-10-CM | POA: Diagnosis present

## 2015-09-17 DIAGNOSIS — M3219 Other organ or system involvement in systemic lupus erythematosus: Secondary | ICD-10-CM

## 2015-09-17 DIAGNOSIS — Z3202 Encounter for pregnancy test, result negative: Secondary | ICD-10-CM | POA: Insufficient documentation

## 2015-09-17 DIAGNOSIS — I776 Arteritis, unspecified: Secondary | ICD-10-CM | POA: Diagnosis not present

## 2015-09-17 DIAGNOSIS — R112 Nausea with vomiting, unspecified: Secondary | ICD-10-CM | POA: Insufficient documentation

## 2015-09-17 DIAGNOSIS — M329 Systemic lupus erythematosus, unspecified: Secondary | ICD-10-CM

## 2015-09-17 DIAGNOSIS — Z79899 Other long term (current) drug therapy: Secondary | ICD-10-CM | POA: Diagnosis not present

## 2015-09-17 DIAGNOSIS — M609 Myositis, unspecified: Secondary | ICD-10-CM

## 2015-09-17 LAB — URINALYSIS, ROUTINE W REFLEX MICROSCOPIC
BILIRUBIN URINE: NEGATIVE
Glucose, UA: NEGATIVE mg/dL
Hgb urine dipstick: NEGATIVE
KETONES UR: NEGATIVE mg/dL
Leukocytes, UA: NEGATIVE
NITRITE: NEGATIVE
PROTEIN: NEGATIVE mg/dL
Specific Gravity, Urine: >1.03 — ABNORMAL HIGH (ref 1.005–1.030)
pH: 6 (ref 5.0–8.0)

## 2015-09-17 LAB — CBC WITH DIFFERENTIAL/PLATELET
BASOS PCT: 0 %
Basophils Absolute: 0 10*3/uL (ref 0.0–0.1)
EOS PCT: 1 %
Eosinophils Absolute: 0.1 10*3/uL (ref 0.0–0.7)
HEMATOCRIT: 39.7 % (ref 36.0–46.0)
Hemoglobin: 12.8 g/dL (ref 12.0–15.0)
Lymphocytes Relative: 15 %
Lymphs Abs: 1.3 10*3/uL (ref 0.7–4.0)
MCH: 28.8 pg (ref 26.0–34.0)
MCHC: 32.2 g/dL (ref 30.0–36.0)
MCV: 89.4 fL (ref 78.0–100.0)
MONO ABS: 0.8 10*3/uL (ref 0.1–1.0)
MONOS PCT: 9 %
NEUTROS ABS: 6.6 10*3/uL (ref 1.7–7.7)
Neutrophils Relative %: 75 %
Platelets: 341 10*3/uL (ref 150–400)
RBC: 4.44 MIL/uL (ref 3.87–5.11)
RDW: 14.6 % (ref 11.5–15.5)
WBC: 8.7 10*3/uL (ref 4.0–10.5)

## 2015-09-17 LAB — C-REACTIVE PROTEIN: CRP: 1.6 mg/dL — AB (ref <1.0)

## 2015-09-17 LAB — COMPREHENSIVE METABOLIC PANEL
ALBUMIN: 3.6 g/dL (ref 3.5–5.0)
ALT: 32 U/L (ref 14–54)
ANION GAP: 7 (ref 5–15)
AST: 55 U/L — ABNORMAL HIGH (ref 15–41)
Alkaline Phosphatase: 49 U/L (ref 38–126)
BILIRUBIN TOTAL: 0.5 mg/dL (ref 0.3–1.2)
BUN: 8 mg/dL (ref 6–20)
CO2: 28 mmol/L (ref 22–32)
Calcium: 9.9 mg/dL (ref 8.9–10.3)
Chloride: 106 mmol/L (ref 101–111)
Creatinine, Ser: 0.64 mg/dL (ref 0.44–1.00)
GFR calc Af Amer: >60 mL/min (ref >60)
GLUCOSE: 94 mg/dL (ref 65–99)
POTASSIUM: 4 mmol/L (ref 3.5–5.1)
Sodium: 141 mmol/L (ref 135–145)
TOTAL PROTEIN: 7.2 g/dL (ref 6.5–8.1)

## 2015-09-17 LAB — SEDIMENTATION RATE: SED RATE: 35 mm/h — AB (ref 0–22)

## 2015-09-17 LAB — CK: Total CK: 1485 U/L — ABNORMAL HIGH (ref 38–234)

## 2015-09-17 LAB — HCG, SERUM, QUALITATIVE: Preg, Serum: NEGATIVE

## 2015-09-17 LAB — LIPASE, BLOOD: LIPASE: 27 U/L (ref 11–51)

## 2015-09-17 MED ORDER — PREDNISONE 10 MG PO TABS: 60.0000 mg | ORAL_TABLET | Freq: Every day | ORAL | Status: DC

## 2015-09-17 MED ORDER — SODIUM CHLORIDE 0.9 % IV BOLUS (SEPSIS)
1000.0000 mL | Freq: Once | INTRAVENOUS | Status: AC
  Administered 2015-09-17: 1000 mL via INTRAVENOUS

## 2015-09-17 MED ORDER — ONDANSETRON HCL 4 MG/2ML IJ SOLN
4.0000 mg | Freq: Once | INTRAMUSCULAR | Status: AC
  Administered 2015-09-17: 4 mg via INTRAVENOUS

## 2015-09-17 MED ORDER — ONDANSETRON HCL 4 MG/2ML IJ SOLN
4.0000 mg | Freq: Once | INTRAMUSCULAR | Status: DC
  Filled 2015-09-17: qty 2

## 2015-09-17 MED ORDER — HYDROCODONE-ACETAMINOPHEN 5-325 MG PO TABS: 1.0000 | ORAL_TABLET | ORAL | Status: DC | PRN

## 2015-09-17 MED ORDER — PREDNISONE 50 MG PO TABS
60.0000 mg | ORAL_TABLET | Freq: Once | ORAL | Status: AC
  Administered 2015-09-17: 60 mg via ORAL
  Filled 2015-09-17: qty 1

## 2015-09-17 MED ORDER — MORPHINE SULFATE (PF) 2 MG/ML IV SOLN
4.0000 mg | Freq: Once | INTRAVENOUS | Status: AC
  Administered 2015-09-17: 4 mg via INTRAVENOUS
  Filled 2015-09-17: qty 2

## 2015-09-17 NOTE — ED Notes (Signed)
MD at bedside. 

## 2015-09-17 NOTE — ED Notes (Signed)
Pt to radiology.

## 2015-09-17 NOTE — Discharge Instructions (Signed)
Please see your Rheumatologist as scheduled in 3 days. Continues steroids as prescribed. Return without fail for worsening symptoms, including increased swelling, increased pain, progressive numbness to the hands or forearm, or any other symptoms concerning to you.  Systemic Lupus Erythematosus, Adult Systemic lupus erythematosus is a long-term (chronic) disease that can affect many parts of the body. It can damage the skin, joints, blood vessels, brain, kidneys, lungs, heart, and other internal organs. It causes pain, irritation, and inflammation. Systemic lupus erythematosus is an autoimmune disease. With this type of disease, the body's defense system (immune system) mistakenly attacks normal tissues instead of attacking germs or abnormal growths. CAUSES The cause of this condition is not known. RISK FACTORS This condition is more likely to develop in:  Females.  People of Asian descent.  People of African-American descent.  People who have a family history of the condition. SYMPTOMS General symptoms include:  Joint pain and swelling (common).  Fever.  Fatigue.  Unusual weight loss or weight gain.  Skin rashes, especially over the nose and cheeks (butterfly rash) and after sun exposure.  Sores inside the mouth or nose. Other symptoms depend on which parts of the body are affected. They can include:  Shortness of breath.  Chest pain.  Frequent urination.  Blood in the urine.  Seizures.  Mental changes.  Hair loss.  Swollen and tender lymph nodes.  Swelling of the hands or feet. Symptoms can come and go. A period of time when symptoms get worse or come back is called a flare. A period of time with no symptoms is called a remission. DIAGNOSIS This condition is diagnosed based on symptoms, a medical history, and a physical exam. You may also have tests, including:  Blood tests.  Urine tests.  A chest X-ray.  A skin or kidney biopsy. For this test, a sample  of tissue is taken from the skin or kidney and studied under a microscope. You may be referred to an autoimmune disease specialist (rheumatologist). TREATMENT There is no cure for this condition, but treatment can keep the disease in remission, help to control symptoms, and prevent damage to the heart, lungs, kidneys, and other organs. Treatment may involve taking a combination of medicines over time. HOME CARE INSTRUCTIONS Medicines  Take medicines only as directed by your health care provider.  Do not take any medicines that contain estrogen without first checking with your health care provider. Estrogen can trigger flares and may increase your risk for blood clots. Lifestyle  Eat a heart-healthy diet.  Stay active as directed by your health care provider.  Do not smoke. If you need help quitting, ask your health care provider.  Protect your skin from the sun by applying sunblock and wearing protective hats and clothing.  Learn as much as you can about your condition and have a good support system in place. Support may come from family, friends, or a lupus support group. General Instructions  Keep all follow-up visits as directed by your health care provider. This is important.  Work closely with all of your health care providers to manage your condition.  Let your health care provider know right away if you become pregnant or if you plan to become pregnant. Pregnancy in women with this condition is considered high risk. SEEK MEDICAL CARE IF:  You have a fever.  Your symptoms flare.  You develop new symptoms.  You develop swollen feet or hands.  You develop puffiness around your eyes.  Your medicines are not  working.  You have bloody, foamy, or coffee-colored urine.  There are changes in your urination. For example, you urinate more often at night.  You think that you may be depressed or have anxiety. SEEK IMMEDIATE MEDICAL CARE IF:  You have chest pain.  You have  trouble breathing.  You have a seizure.  You suddenly get a very bad headache.  You suddenly develop facial or body weakness.  You cannot speak.  You cannot understand speech.   This information is not intended to replace advice given to you by your health care provider. Make sure you discuss any questions you have with your health care provider.   Document Released: 08/21/2002 Document Revised: 01/15/2015 Document Reviewed: 08/08/2014 Elsevier Interactive Patient Education [employer]

## 2015-09-17 NOTE — ED Notes (Signed)
Having fever and pain to right elbow for couple days.  Rates pain 10/10.

## 2015-09-17 NOTE — ED Provider Notes (Addendum)
CSN: [id number]     Arrival date & time 09/17/15  0754 History  By signing my name below, I, Monica Ashley, attest that this documentation has been prepared under the direction and in the presence of No att. providers found. Electronically Signed: Marica Ashley, ED Scribe. 09/17/2015. 8:45 AM.   Chief Complaint  Patient presents with  . Fever  . Elbow Pain    right   The history is provided by the patient. No language interpreter was used.  PCP: Monica Post, MD HPI Comments: Monica Ashley is a 35 y.o. female, accompanied by her mother, with PMHx noted below including lupus (treated with prednisone, methotrexate, proguanil), history of lupus vasculitis and myositis, Raynaud's history, and gastroparesis, who presents to the Emergency Department with right arm pain and fever.  First, pt complains of intermittent fever onset two days ago. Pt reports taking ibuprofen at home for her fever with relief.   Second, pt complains of atraumatic, severe, 10/10, acute pain to right elbow and forearm, Associated Sx include swelling to right hand and right elbow/forearm, mild weakness of right hand with tingling, and limited ROM of right elbow secondary to pain onset a couple of days ago. Pt denies any upper arm swelling.   Lastly, pt complains of n/v onset three days ago with last episode of vomiting occuring last night. Pt notes she had an episode of haematemesis 3 days ago, now fully resolved. No melena or hematochezia. Pt reports frequent n/v at baseline.  Associated Sx include diarrhea onset at 5:30AM--pt reports three episodes of diarrhea since onset.   Pt denies abd pain, cough, rhinorrhea, sore throat, dysuria, urinary frequency.   Past Medical History  Diagnosis Date  . Lupus (HCC)   . Gastroparesis    Past Surgical History  Procedure Laterality Date  . Cholecystectomy    . Cesarean section    . Tonsillectomy and adenoidectomy     Family History  Problem Relation Age of Onset   . Gout Mother   . Hypertension Mother   . Gout Father    Social History  Substance Use Topics  . Smoking status: Never Smoker   . Smokeless tobacco: None  . Alcohol Use: No   OB History    No data available     Review of Systems  Constitutional: Positive for fever.  HENT: Negative for sore throat.   Respiratory: Negative for cough.   Gastrointestinal: Positive for nausea, vomiting and diarrhea. Negative for abdominal pain.  Genitourinary: Negative for dysuria and frequency.  Musculoskeletal: Positive for joint swelling (bilateral hands and right elbow) and arthralgias (right elbow with limited ROM secondary to pain).  All other systems reviewed and are negative.  Allergies  Codeine  Home Medications   Prior to Admission medications   Medication Sig Start Date End Date Taking? Authorizing Provider  acetaminophen (TYLENOL) 500 MG tablet Take 1,000 mg by mouth every 6 (six) hours as needed for mild pain.   Yes Historical Provider, MD  albuterol (PROVENTIL HFA;VENTOLIN HFA) 108 (90 BASE) MCG/ACT inhaler Inhale 1-2 puffs into the lungs every 6 (six) hours as needed for wheezing or shortness of breath. 06/24/15  Yes Trixie Dredge, PA-C  amLODipine (NORVASC) 5 MG tablet Take 5 mg by mouth daily.   Yes Historical Provider, MD  cholecalciferol (VITAMIN D) 1000 units tablet Take 2,000 Units by mouth daily.   Yes Historical Provider, MD  dexlansoprazole (DEXILANT) 60 MG capsule Take 60 mg by mouth daily.   Yes Historical Provider,  MD  diazepam (VALIUM) 5 MG tablet Take 5 mg by mouth at bedtime.    Yes Historical Provider, MD  folic acid (FOLVITE) 1 MG tablet Take 1 mg by mouth daily.   Yes Historical Provider, MD  gabapentin (NEURONTIN) 300 MG capsule Take 300 mg by mouth 2 (two) times daily.   Yes Historical Provider, MD  hydroxychloroquine (PLAQUENIL) 200 MG tablet Take 400 mg by mouth daily.   Yes Historical Provider, MD  ibuprofen (ADVIL,MOTRIN) 200 MG tablet Take 400 mg by mouth every  6 (six) hours as needed for fever.   Yes Historical Provider, MD  methotrexate 50 MG/2ML injection Inject 50 mg/m2 into the vein once a week.   Yes Historical Provider, MD  ondansetron (ZOFRAN-ODT) 4 MG disintegrating tablet Take 4 mg by mouth every 6 (six) hours as needed for nausea or vomiting.   Yes Historical Provider, MD  OVER THE COUNTER MEDICATION Take 1 tablet by mouth at bedtime. OTC acid reducer   Yes Historical Provider, MD  predniSONE (DELTASONE) 5 MG tablet Take 10 mg by mouth daily with breakfast.   Yes Historical Provider, MD  promethazine (PHENERGAN) 25 MG tablet Take 25 mg by mouth every 6 (six) hours as needed for nausea or vomiting.   Yes Historical Provider, MD  HYDROcodone-acetaminophen (NORCO/VICODIN) 5-325 MG tablet Take 1-2 tablets by mouth every 4 (four) hours as needed for moderate pain or severe pain. 09/17/15   Lavera Guiseana Duo Wilma Wuthrich, MD  predniSONE (DELTASONE) 10 MG tablet Take 6 tablets (60 mg total) by mouth daily. 09/18/15   Lavera Guiseana Duo Davene Jobin, MD   Triage Vitals: BP 144/84 mmHg  Pulse 91  Temp(Src) 98.3 F (36.8 C) (Oral)  Resp 16  Ht 5' 5.5" (1.664 m)  Wt 239 lb (108.41 kg)  BMI 39.15 kg/m2  SpO2 99% Physical Exam  Constitutional: She is oriented to person, place, and time. She appears well-developed and well-nourished.  HENT:  Head: Normocephalic.  Eyes: EOM are normal.  Neck: Normal range of motion.  Pulmonary/Chest: Effort normal.  Abdominal: She exhibits no distension.  Musculoskeletal: Normal range of motion.       Right elbow: She exhibits swelling (from elbow to hand). Tenderness (at right elbow and proximal muscles of forearm) found.  Minimally decreased cap refill on right when compared to left. +2 radial pulses bilaterally. Intact innervation radial, ulnar and medial nerves. No deformtiees, swelling, redness or warmth at elbow joint. Compartments of forearm are soft.  Neurological: She is alert and oriented to person, place, and time.     Skin: Skin is warm and  dry.  Psychiatric: She has a normal mood and affect.  Nursing note and vitals reviewed.   ED Course  Procedures (including critical care time) DIAGNOSTIC STUDIES: Oxygen Saturation is 99% on ra, nl by my interpretation.    COORDINATION OF CARE: 8:23 AM: Discussed treatment plan which includes imaging, labs, meds, and IV fluids with pt at bedside; patient verbalizes understanding and agrees with treatment plan.  Labs Review Labs Reviewed  SEDIMENTATION RATE - Abnormal; Notable for the following:    Sed Rate 35 (*)    All other components within normal limits  C-REACTIVE PROTEIN - Abnormal; Notable for the following:    CRP 1.6 (*)    All other components within normal limits  CK - Abnormal; Notable for the following:    Total CK 1485 (*)    All other components within normal limits  COMPREHENSIVE METABOLIC PANEL - Abnormal; Notable for the  following:    AST 55 (*)    All other components within normal limits  URINALYSIS, ROUTINE W REFLEX MICROSCOPIC (NOT AT [facility]) - Abnormal; Notable for the following:    Specific Gravity, Urine >1.030 (*)    All other components within normal limits  CBC WITH DIFFERENTIAL/PLATELET  LIPASE, BLOOD  HCG, SERUM, QUALITATIVE   Imaging Review Dg Elbow Complete Right  09/17/2015  CLINICAL DATA:  Swelling.  History of lupus.  Initial evaluation . EXAM: RIGHT ELBOW - COMPLETE 3+ VIEW COMPARISON:  No prior. FINDINGS: No acute bony or joint abnormality identified. No evidence of fracture or dislocation. IMPRESSION: No acute abnormality. Electronically Signed   By: Maisie Fus  Register   On: 09/17/2015 09:18   US Venous Img Upper Uni Right  09/17/2015  CLINICAL DATA:  Pain and swelling x3 days.  Lupus. EXAM: RIGHT UPPER EXTREMITY VENOUS DOPPLER ULTRASOUND TECHNIQUE: Gray-scale sonography with graded compression, as well as color Doppler and duplex ultrasound were performed to evaluate the upper extremity deep venous system from the level of the subclavian vein  and including the jugular, axillary, basilic and upper cephalic vein. Spectral Doppler was utilized to evaluate flow at rest and with distal augmentation maneuvers. COMPARISON:  None. FINDINGS: Thrombus within deep veins:  None visualized. Compressibility of deep veins:  Normal. Duplex waveform respiratory phasicity:  Normal. Duplex waveform response to augmentation:  Normal. Venous reflux:  None visualized. Other findings: Limited images of contralateral left IJ and subclavian veins unremarkable. IMPRESSION: 1. Negative for right upper extremity DVT. Electronically Signed   By: Corlis Leak M.D.   On: 09/17/2015 10:52   [place] Upper Ext Art Right  09/17/2015  CLINICAL DATA:  35 year old female with a history of lupus vasculitis and new symptoms of tingling and swelling in the right upper extremity. EXAM: ULTRASOUND RIGHT UPPER EXTREMITY COMPLETE TECHNIQUE: Ultrasound examination was performed including evaluation of the muscles, tendons, joint, and adjacent soft tissues. COMPARISON:  Concurrently obtained duplex venous ultrasound of the right upper extremity FINDINGS: Right wrist brachial index:  0.53 Normal triphasic high resistance arterial waveforms throughout the visualized right upper extremity arterial tree including the subclavian, axillary, brachial, radial and ulnar arteries. No evidence of occlusion. No focal elevation of the peak systolic velocity to suggest a focal stenosis. No atherosclerotic plaque visualized. IMPRESSION: 1. Abnormal right wrist brachial index of 0.53 suggests an element of underlying arterial insufficiency. Given the patient's clinical history, this may be due to vasculitis or arterial spasm. 2. No visualized atherosclerotic plaque, occlusion or focal elevation of the peak systolic velocity to suggest a focal stenosis. 3. The arterial waveforms are normal throughout the right upper extremity. Signed, Sterling Big, MD [facility]  Radiology Electronically Signed   By: Malachy Moan M.D.   On: 09/17/2015 10:20   I have personally reviewed and evaluated these images and lab results as part of my medical decision-making.  MDM   Final diagnoses:  Lupus vasculitis (HCC)  SLE exacerbation (HCC)  Myositis    35 year old female with history of lupus complicated by vasculitis and myositis who presents with right elbow and forearm pain with swelling. On arrival she is afebrile and hemodynamically stable. She is in no acute distress. On exam, she is noted to have mild edema associated with her right arm extending from her elbow to her hand. It is neurovascularly intact, and I suspect that her tingling and reported weakness is more due to the swelling that she is  experiencing. Her compartments are soft. There is no redness or isolated swelling of the joint that would be concerning for septic arthritis. An x-ray of her elbow also reveals no evidence of effusion. Blood work is concerning for elevated inflammatory markers and elevated CK of 1480, that is concerning for element of myositis with a lupus flare. Ultrasound of her right upper extremity reveals no evidence of DVT. Although pulses are palpable, she does have element of decreased right wrist brachial index of 0.53, Which may suggest a component of vasculitis as well. I discussed these findings with the patient's rheumatologist from [facility], Dr. Tami Ribas, who feels that presentation may be consistent with a lupus flare. He recommended steroid burst of 60 mg of prednisone and she has a scheduled appointment with him in 3 days for follow-up. Did not feel that patient required transfer to [facility] for eval or required admission for treatment of flare. First dose prednisone given here and given prescription for steroid burst.  Also discussed with vascular surgery given abnormal arterial studies and in the setting of SLE flare without evidence of limb ischemia or compartment syndrome recommended  treatment for flare. Discussed findings and treatment with the patient, who felt comfortable with outpatient management and close follow-up with rheumatology in 3 days at [facility]. Discussed warning signs of limb ischemia and compartment syndrome with patient and discussed strict return if any worsening symptoms. She expressed understanding of all discharge instructions and felt comfortable with the plan of care.   I personally performed the services described in this documentation, which was scribed in my presence. The recorded information has been reviewed and is accurate.    Lavera Guise, MD 09/17/15 1534  Lavera Guise, MD 09/17/15 1535

## 2015-09-17 NOTE — ED Notes (Signed)
Pt c/o fever x 2 days. Increased swelling in bilateral hands and right elbow. Pt c/o right elbow pain, limited ROM d/t pain. Denies injury. Pt also c/o vomiting daily d/t gastroparesis but increased in vomiting on Saturday with increased nausea and diarrhea since Saturday. Pt unable to get appointment with pcp today. Pt states he main concern is the pain and joint swelling in her right elbow/arm.

## 2015-09-17 NOTE — ED Notes (Signed)
Patient is resting comfortably. 

## 2015-12-09 IMAGING — CT CT ABDOMEN W/ CM
3 of 4 series · 13 of 36 positions shown, 19 images · IV contrast (READICAT/WATER & [ID] OMNI 300)
Comparison: None.

CLINICAL DATA: Severe epigastric pain. Anorexia. 40 lb weight loss
in past 6 months.

EXAM:
CT ABDOMEN WITH CONTRAST
TECHNIQUE: Multidetector CT imaging of the abdomen was performed using the
standard protocol following bolus administration of intravenous
contrast.
CONTRAST:  125mL OMNIPAQUE IOHEXOL 300 MG/ML  SOLN

[Series 3: abdomen with · axial · 0.92mm/px · z∈[-231,-6]mm · 5 of 69 slices shown, 10 images]
[im 12/69  soft-tissue]
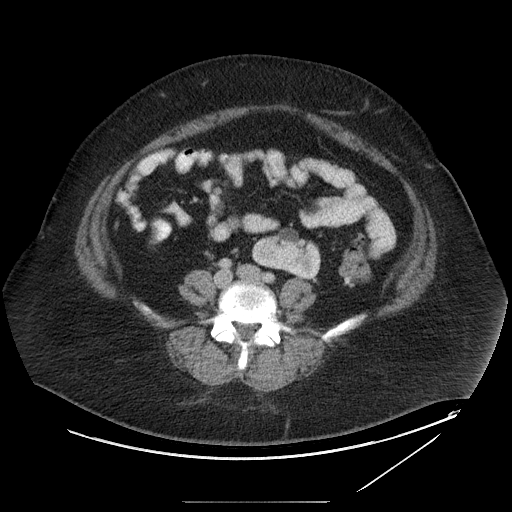
[im 12/69  bone]
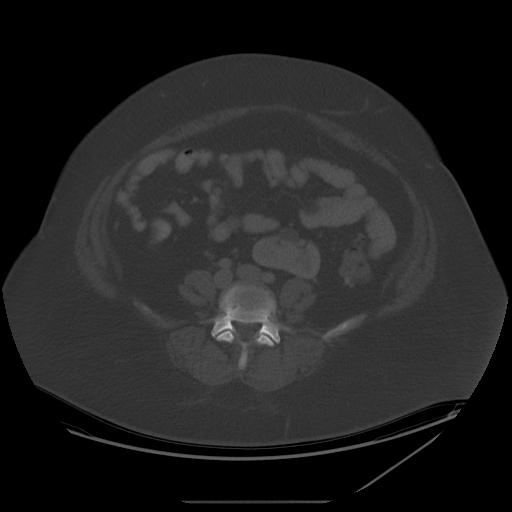
[im 23/69  soft-tissue]
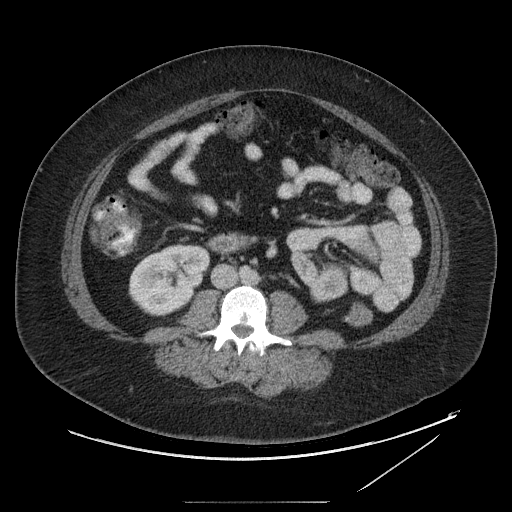
[im 23/69  lung]
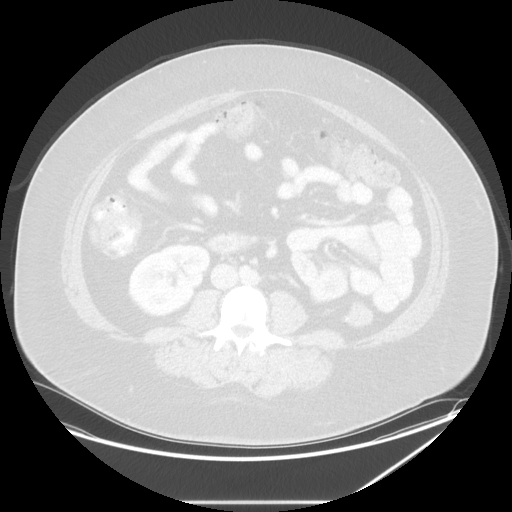
[im 35/69  soft-tissue]
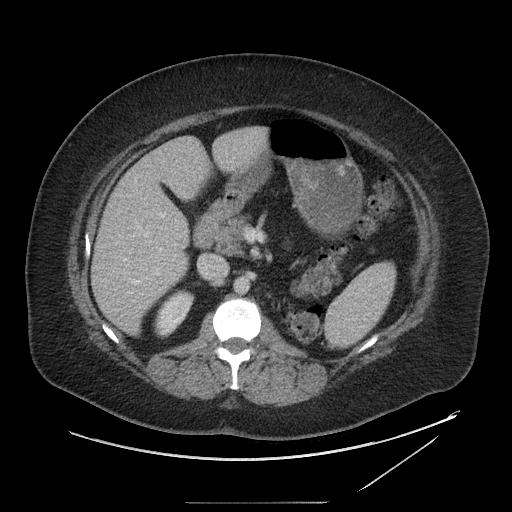
[im 35/69  lung]
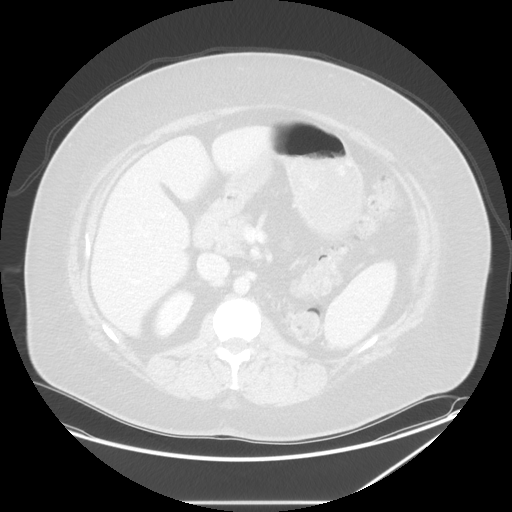
[im 46/69  soft-tissue]
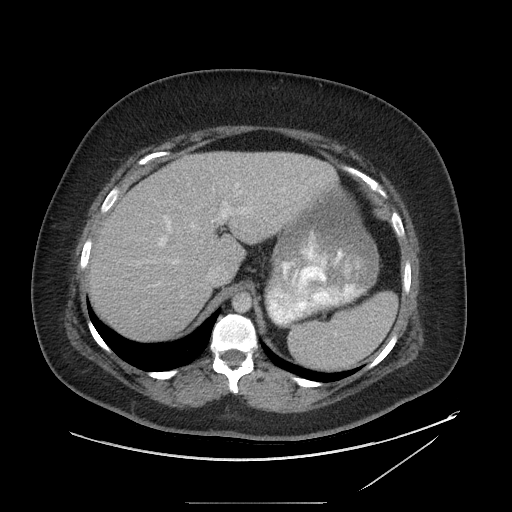
[im 46/69  lung]
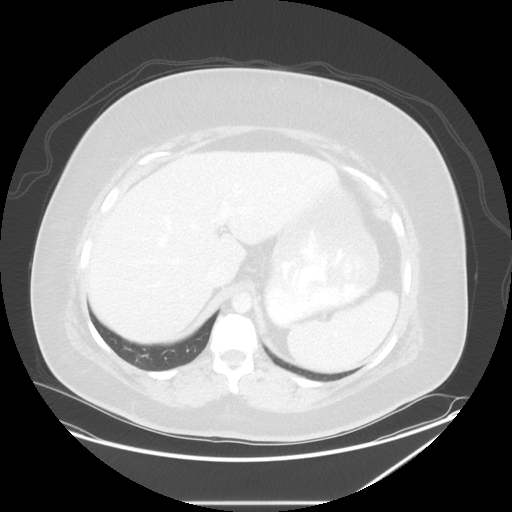
[im 57/69  soft-tissue]
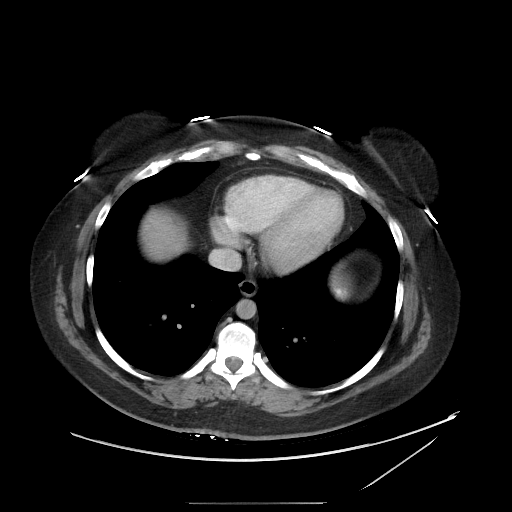
[im 57/69  lung]
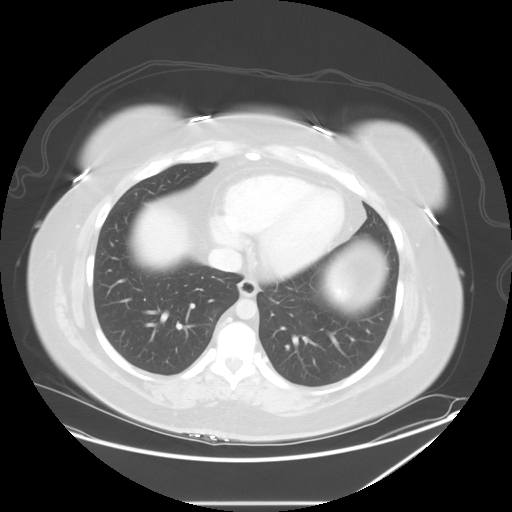

[Series 601: coronal body · coronal · 0.92mm/px · 1 of 151 slices shown, 2 images]
[im 51/151  soft-tissue]
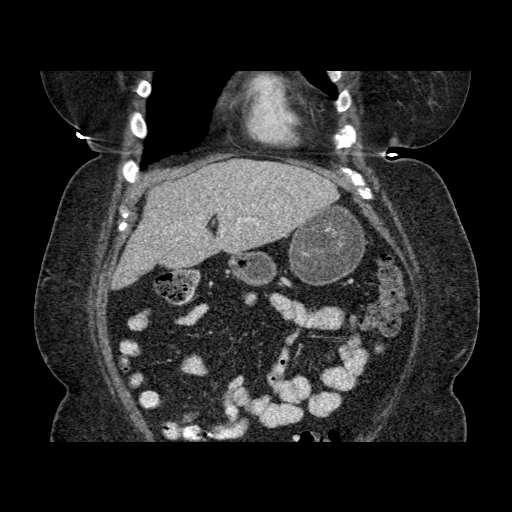
[im 51/151  bone]
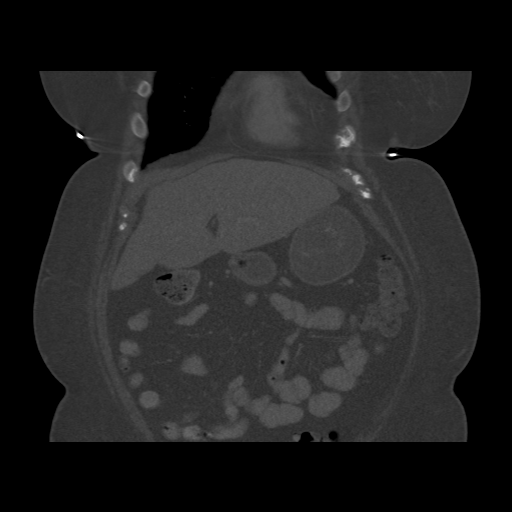

[Series 602: sagittal body · sagittal · 0.92mm/px · 7 of 189 slices shown]
[im 19/189  soft-tissue]
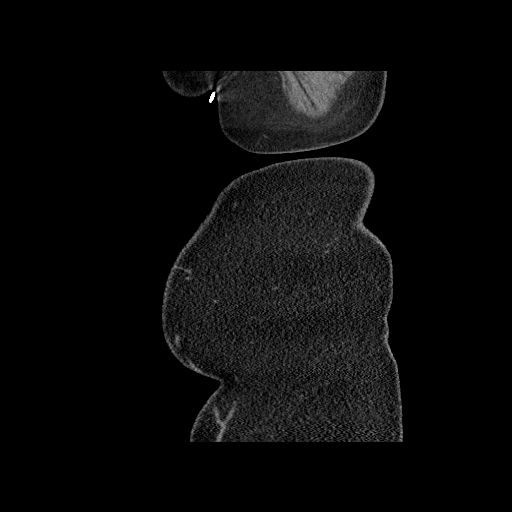
[im 38/189  soft-tissue]
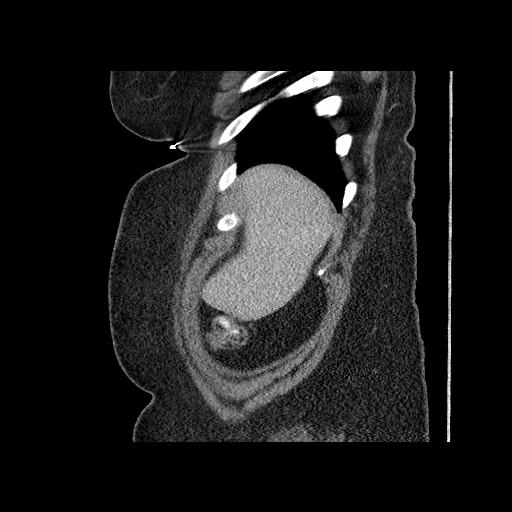
[im 57/189  soft-tissue]
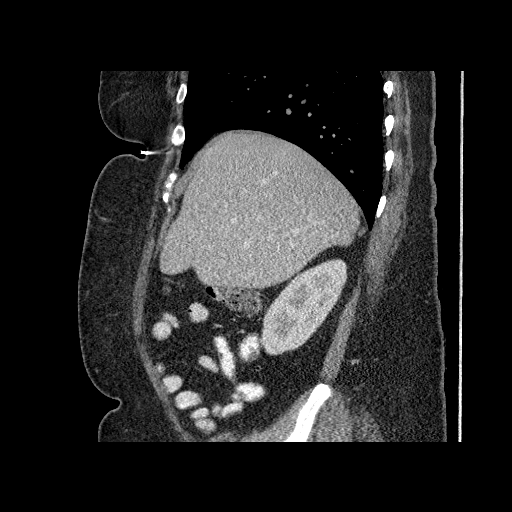
[im 85/189  soft-tissue]
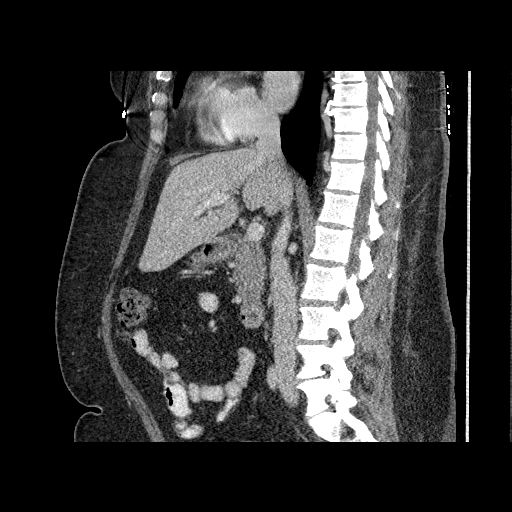
[im 104/189  soft-tissue]
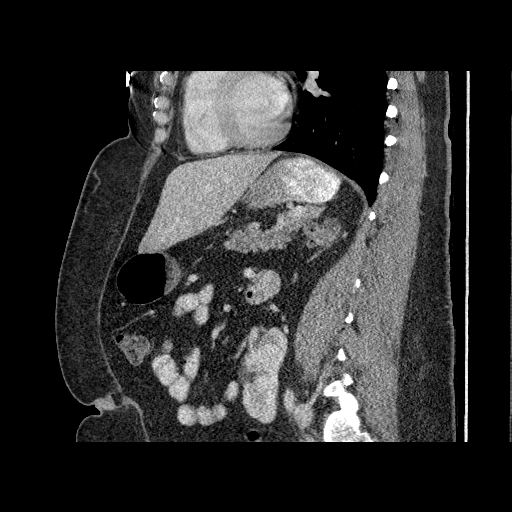
[im 132/189  soft-tissue]
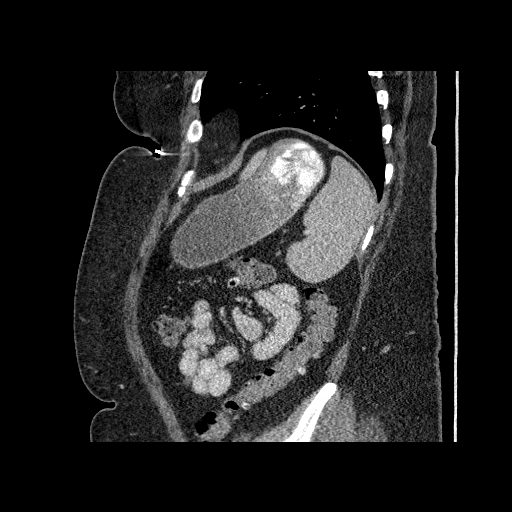
[im 151/189  soft-tissue]
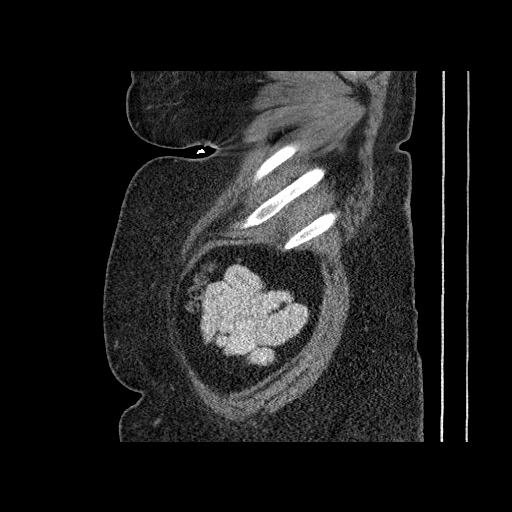

[13 of 36 positions shown; findings below may reference images not displayed]

FINDINGS: Ectopic location of left kidney noted in the left lower quadrant. No
renal masses or hydronephrosis seen involving either kidney.

Surgical clips from prior cholecystectomy noted. No evidence of
biliary dilatation. The liver, pancreas, spleen, and adrenal glands
are normal in appearance. No soft tissue masses or lymphadenopathy
identified.

Colonic diverticulosis is noted, however there is no evidence of
diverticulitis or other inflammatory process in the visualized
portion the abdomen. The visualized abdominal bowel loops are
otherwise unremarkable in appearance. No abnormal fluid collections
identified.
IMPRESSION: No acute findings.

Diverticulosis. No radiographic evidence of diverticulitis within
the visualized abdomen.

Left pelvic kidney.

## 2016-03-13 DIAGNOSIS — E669 Obesity, unspecified: Secondary | ICD-10-CM | POA: Diagnosis not present

## 2016-03-13 DIAGNOSIS — F419 Anxiety disorder, unspecified: Secondary | ICD-10-CM | POA: Diagnosis not present

## 2016-03-13 DIAGNOSIS — R0781 Pleurodynia: Secondary | ICD-10-CM | POA: Diagnosis not present

## 2016-03-13 DIAGNOSIS — R1312 Dysphagia, oropharyngeal phase: Secondary | ICD-10-CM | POA: Diagnosis not present

## 2016-03-13 DIAGNOSIS — Z7952 Long term (current) use of systemic steroids: Secondary | ICD-10-CM | POA: Diagnosis not present

## 2016-03-13 DIAGNOSIS — R748 Abnormal levels of other serum enzymes: Secondary | ICD-10-CM | POA: Diagnosis not present

## 2016-03-13 DIAGNOSIS — M329 Systemic lupus erythematosus, unspecified: Secondary | ICD-10-CM | POA: Diagnosis not present

## 2016-03-13 DIAGNOSIS — R000 Tachycardia, unspecified: Secondary | ICD-10-CM | POA: Diagnosis not present

## 2016-03-13 DIAGNOSIS — K219 Gastro-esophageal reflux disease without esophagitis: Secondary | ICD-10-CM | POA: Diagnosis not present

## 2016-03-13 DIAGNOSIS — I776 Arteritis, unspecified: Secondary | ICD-10-CM | POA: Diagnosis not present

## 2016-03-13 DIAGNOSIS — J690 Pneumonitis due to inhalation of food and vomit: Secondary | ICD-10-CM | POA: Diagnosis not present

## 2016-03-13 DIAGNOSIS — G43909 Migraine, unspecified, not intractable, without status migrainosus: Secondary | ICD-10-CM | POA: Diagnosis not present

## 2016-03-13 DIAGNOSIS — R918 Other nonspecific abnormal finding of lung field: Secondary | ICD-10-CM | POA: Diagnosis not present

## 2016-03-13 DIAGNOSIS — Z6841 Body Mass Index (BMI) 40.0 and over, adult: Secondary | ICD-10-CM | POA: Diagnosis not present

## 2016-03-13 DIAGNOSIS — I214 Non-ST elevation (NSTEMI) myocardial infarction: Secondary | ICD-10-CM | POA: Diagnosis not present

## 2016-03-13 DIAGNOSIS — R05 Cough: Secondary | ICD-10-CM | POA: Diagnosis not present

## 2016-03-13 DIAGNOSIS — G4700 Insomnia, unspecified: Secondary | ICD-10-CM | POA: Diagnosis not present

## 2016-03-13 DIAGNOSIS — R1310 Dysphagia, unspecified: Secondary | ICD-10-CM | POA: Diagnosis not present

## 2016-03-13 DIAGNOSIS — M351 Other overlap syndromes: Secondary | ICD-10-CM | POA: Diagnosis not present

## 2016-03-13 DIAGNOSIS — R509 Fever, unspecified: Secondary | ICD-10-CM | POA: Diagnosis not present

## 2016-03-13 DIAGNOSIS — K3184 Gastroparesis: Secondary | ICD-10-CM | POA: Diagnosis not present

## 2016-03-14 DIAGNOSIS — R05 Cough: Secondary | ICD-10-CM | POA: Diagnosis not present

## 2016-03-14 DIAGNOSIS — R1312 Dysphagia, oropharyngeal phase: Secondary | ICD-10-CM | POA: Diagnosis not present

## 2016-03-14 DIAGNOSIS — R509 Fever, unspecified: Secondary | ICD-10-CM | POA: Diagnosis not present

## 2016-03-14 DIAGNOSIS — R748 Abnormal levels of other serum enzymes: Secondary | ICD-10-CM | POA: Diagnosis not present

## 2016-03-14 DIAGNOSIS — R0781 Pleurodynia: Secondary | ICD-10-CM | POA: Diagnosis not present

## 2016-03-15 DIAGNOSIS — R0781 Pleurodynia: Secondary | ICD-10-CM | POA: Diagnosis not present

## 2016-03-15 DIAGNOSIS — M329 Systemic lupus erythematosus, unspecified: Secondary | ICD-10-CM | POA: Diagnosis not present

## 2016-03-15 DIAGNOSIS — R05 Cough: Secondary | ICD-10-CM | POA: Diagnosis not present

## 2016-03-15 DIAGNOSIS — R748 Abnormal levels of other serum enzymes: Secondary | ICD-10-CM | POA: Diagnosis not present

## 2016-03-15 DIAGNOSIS — J690 Pneumonitis due to inhalation of food and vomit: Secondary | ICD-10-CM | POA: Diagnosis not present

## 2016-03-15 DIAGNOSIS — R1312 Dysphagia, oropharyngeal phase: Secondary | ICD-10-CM | POA: Diagnosis not present

## 2016-03-16 DIAGNOSIS — J690 Pneumonitis due to inhalation of food and vomit: Secondary | ICD-10-CM | POA: Diagnosis not present

## 2016-03-16 DIAGNOSIS — R05 Cough: Secondary | ICD-10-CM | POA: Diagnosis not present

## 2016-03-16 DIAGNOSIS — R1312 Dysphagia, oropharyngeal phase: Secondary | ICD-10-CM | POA: Diagnosis not present

## 2016-03-16 DIAGNOSIS — R748 Abnormal levels of other serum enzymes: Secondary | ICD-10-CM | POA: Diagnosis not present

## 2016-03-16 DIAGNOSIS — R0781 Pleurodynia: Secondary | ICD-10-CM | POA: Diagnosis not present

## 2016-03-20 DIAGNOSIS — M351 Other overlap syndromes: Secondary | ICD-10-CM | POA: Diagnosis not present

## 2016-03-20 DIAGNOSIS — M3320 Polymyositis, organ involvement unspecified: Secondary | ICD-10-CM | POA: Diagnosis not present

## 2016-03-20 DIAGNOSIS — M359 Systemic involvement of connective tissue, unspecified: Secondary | ICD-10-CM | POA: Diagnosis not present

## 2016-03-20 DIAGNOSIS — M1990 Unspecified osteoarthritis, unspecified site: Secondary | ICD-10-CM | POA: Diagnosis not present

## 2016-04-17 DIAGNOSIS — M3219 Other organ or system involvement in systemic lupus erythematosus: Secondary | ICD-10-CM | POA: Diagnosis not present

## 2016-04-17 DIAGNOSIS — M3500 Sicca syndrome, unspecified: Secondary | ICD-10-CM | POA: Diagnosis not present

## 2016-04-17 DIAGNOSIS — M3320 Polymyositis, organ involvement unspecified: Secondary | ICD-10-CM | POA: Diagnosis not present

## 2016-04-17 DIAGNOSIS — K3184 Gastroparesis: Secondary | ICD-10-CM | POA: Diagnosis not present

## 2016-04-17 DIAGNOSIS — I7301 Raynaud's syndrome with gangrene: Secondary | ICD-10-CM | POA: Diagnosis not present

## 2016-08-31 DIAGNOSIS — J0100 Acute maxillary sinusitis, unspecified: Secondary | ICD-10-CM | POA: Diagnosis not present

## 2016-10-06 DIAGNOSIS — Z6841 Body Mass Index (BMI) 40.0 and over, adult: Secondary | ICD-10-CM | POA: Diagnosis not present

## 2016-10-06 DIAGNOSIS — M5410 Radiculopathy, site unspecified: Secondary | ICD-10-CM | POA: Diagnosis not present

## 2016-10-06 DIAGNOSIS — M545 Low back pain: Secondary | ICD-10-CM | POA: Diagnosis not present

## 2016-10-06 DIAGNOSIS — R509 Fever, unspecified: Secondary | ICD-10-CM | POA: Diagnosis not present

## 2016-10-06 DIAGNOSIS — Z1389 Encounter for screening for other disorder: Secondary | ICD-10-CM | POA: Diagnosis not present

## 2016-10-13 ENCOUNTER — Other Ambulatory Visit (HOSPITAL_COMMUNITY): Payer: Self-pay | Admitting: Family Medicine

## 2016-10-13 DIAGNOSIS — M545 Low back pain: Secondary | ICD-10-CM

## 2016-10-13 DIAGNOSIS — M79606 Pain in leg, unspecified: Secondary | ICD-10-CM

## 2016-10-14 ENCOUNTER — Ambulatory Visit: Admission: RE | Admit: 2016-10-14 | Payer: BLUE CROSS/BLUE SHIELD | Source: Ambulatory Visit

## 2016-11-20 DIAGNOSIS — R45851 Suicidal ideations: Secondary | ICD-10-CM | POA: Diagnosis not present

## 2016-11-20 DIAGNOSIS — M351 Other overlap syndromes: Secondary | ICD-10-CM | POA: Diagnosis not present

## 2016-11-20 DIAGNOSIS — Z79899 Other long term (current) drug therapy: Secondary | ICD-10-CM | POA: Diagnosis not present

## 2016-11-20 DIAGNOSIS — F329 Major depressive disorder, single episode, unspecified: Secondary | ICD-10-CM | POA: Diagnosis not present

## 2016-11-20 DIAGNOSIS — M3500 Sicca syndrome, unspecified: Secondary | ICD-10-CM | POA: Diagnosis not present

## 2016-11-25 DIAGNOSIS — Z6841 Body Mass Index (BMI) 40.0 and over, adult: Secondary | ICD-10-CM | POA: Diagnosis not present

## 2016-11-25 DIAGNOSIS — Z0000 Encounter for general adult medical examination without abnormal findings: Secondary | ICD-10-CM | POA: Diagnosis not present

## 2016-11-25 DIAGNOSIS — Z1389 Encounter for screening for other disorder: Secondary | ICD-10-CM | POA: Diagnosis not present

## 2016-12-08 ENCOUNTER — Encounter: Payer: Self-pay | Admitting: *Deleted

## 2016-12-16 ENCOUNTER — Encounter: Payer: Self-pay | Admitting: Women's Health

## 2016-12-25 ENCOUNTER — Encounter: Payer: Self-pay | Admitting: Adult Health

## 2016-12-25 ENCOUNTER — Ambulatory Visit (INDEPENDENT_AMBULATORY_CARE_PROVIDER_SITE_OTHER): Payer: BLUE CROSS/BLUE SHIELD | Admitting: Adult Health

## 2016-12-25 VITALS — BP 122/80 | HR 76 | Ht 65.5 in | Wt 282.5 lb

## 2016-12-25 DIAGNOSIS — R109 Unspecified abdominal pain: Secondary | ICD-10-CM | POA: Insufficient documentation

## 2016-12-25 DIAGNOSIS — R6882 Decreased libido: Secondary | ICD-10-CM | POA: Insufficient documentation

## 2016-12-25 DIAGNOSIS — N926 Irregular menstruation, unspecified: Secondary | ICD-10-CM

## 2016-12-25 DIAGNOSIS — Z3202 Encounter for pregnancy test, result negative: Secondary | ICD-10-CM | POA: Diagnosis not present

## 2016-12-25 DIAGNOSIS — N941 Unspecified dyspareunia: Secondary | ICD-10-CM | POA: Diagnosis not present

## 2016-12-25 LAB — POCT URINE PREGNANCY: PREG TEST UR: NEGATIVE

## 2016-12-25 NOTE — Progress Notes (Signed)
Subjective:     Patient ID: Monica Ashley, female   DOB: Jul 16, 1981, 36 y.o.   MRN: [id number]  HPI Monica Ashley is a 36 year old white female, married,referred from [place] is complaining of irregular periods, has only had 5 periods in last 7 years.It started after having imbedded IUD removed in 2011.She has cramps and pain with sex and decreased sex drive.The painful sex has been for about 3 years, since being diagnosed with Lupus.She is followed at Lupus clinic at [facility].She is stay at home home, has 3 children. She had 2 Csections.She says she can't remember last period.   Review of Systems  +irregular periods, only 5 in last 7 years Pain with sex, for about 3 years +cramps Decreased libido  Reviewed past medical,surgical, social and family history. Reviewed medications and allergies.     Objective:   Physical Exam BP 122/80 (BP Location: Right Arm, Patient Position: Sitting, Cuff Size: Large)   Pulse 76   Ht 5' 5.5" (1.664 m)   Wt 282 lb 8 oz (128.1 kg)   BMI 46.30 kg/m UPT negative.  Skin warm and dry. Neck: mid line trachea, normal thyroid, good ROM, no lymphadenopathy noted. Lungs: clear to ausculation bilaterally. Cardiovascular: regular rate and rhythm. Abdomen is soft and non tender. Pelvic: external genitalia is normal in appearance no lesions,urethra has no lesions or masses noted,slightly tender, cervix:smooth and nulliparous,no CMT, uterus: normal size, shape and contour, + tender, no masses felt, adnexa: no masses or tenderness noted. Bladder is non tender and no masses felt.    Discussed that will get labs and [place] and change positions with sex, esp since husband's penis has curvature.Also discussed that her meds could affect libido.  Depending on labs and [place] results, may try to provera 10 mg for 10 days to get withdrawal bleed. She does not want any more children.  PHQ 2 score 0. Face time 30 minutes with 50 % counseling and coordinating care.   Assessment:      1. Irregular periods   2. Pregnancy examination or test, negative result   3. Abdominal cramps   4. Dyspareunia in female   5. Decreased libido       Plan:     Check FSH, TSH, free T4 and thyroid antibodies Return in 1 week for GYN [place]

## 2016-12-26 LAB — TSH: TSH: 0.868 u[IU]/mL (ref 0.450–4.500)

## 2016-12-26 LAB — THYROID PEROXIDASE ANTIBODY: Thyroperoxidase Ab SerPl-aCnc: 15 IU/mL (ref 0–34)

## 2016-12-26 LAB — T4, FREE: Free T4: 1.2 ng/dL (ref 0.82–1.77)

## 2016-12-26 LAB — FOLLICLE STIMULATING HORMONE: FSH: 3 m[IU]/mL

## 2016-12-30 ENCOUNTER — Other Ambulatory Visit: Payer: BLUE CROSS/BLUE SHIELD

## 2016-12-31 ENCOUNTER — Ambulatory Visit (INDEPENDENT_AMBULATORY_CARE_PROVIDER_SITE_OTHER): Payer: BLUE CROSS/BLUE SHIELD

## 2016-12-31 DIAGNOSIS — R109 Unspecified abdominal pain: Secondary | ICD-10-CM

## 2016-12-31 DIAGNOSIS — N926 Irregular menstruation, unspecified: Secondary | ICD-10-CM | POA: Diagnosis not present

## 2016-12-31 NOTE — Progress Notes (Signed)
PELVIC US TA/TV: homogeneous anteverted uterus,wnl,normal ov's bilat,limited view of left ovary,no pain during ultrasound,no free fluid,EEC 2.8 MM

## 2017-01-01 ENCOUNTER — Other Ambulatory Visit: Payer: BLUE CROSS/BLUE SHIELD

## 2017-01-05 ENCOUNTER — Telehealth: Payer: Self-pay | Admitting: Adult Health

## 2017-01-05 NOTE — Telephone Encounter (Signed)
Pt aware [place] was normal, will come in am for UPT and will rx provera

## 2017-01-05 NOTE — Telephone Encounter (Signed)
Left message to call me about US °

## 2017-01-06 ENCOUNTER — Encounter: Payer: Self-pay | Admitting: Adult Health

## 2017-01-06 ENCOUNTER — Ambulatory Visit (INDEPENDENT_AMBULATORY_CARE_PROVIDER_SITE_OTHER): Payer: BLUE CROSS/BLUE SHIELD | Admitting: Adult Health

## 2017-01-06 VITALS — BP 122/82 | HR 84 | Ht 65.5 in | Wt 283.5 lb

## 2017-01-06 DIAGNOSIS — Z3202 Encounter for pregnancy test, result negative: Secondary | ICD-10-CM

## 2017-01-06 DIAGNOSIS — N926 Irregular menstruation, unspecified: Secondary | ICD-10-CM

## 2017-01-06 LAB — POCT URINE PREGNANCY: PREG TEST UR: NEGATIVE

## 2017-01-06 MED ORDER — MEDROXYPROGESTERONE ACETATE 10 MG PO TABS: 10.0000 mg | ORAL_TABLET | Freq: Every day | ORAL | 0 refills | Status: DC

## 2017-01-06 NOTE — Progress Notes (Signed)
Subjective:     Patient ID: Monica Ashley, female   DOB: [birth year], 36 y.o.   MRN: [id number]  HPI Monica Ashley is a 36 year old white female in for UPT so can start on provera.Periods irregular, has only had 5 in last 7 years.It started after having imbedded IUD removed in 2011.  Review of Systems +irregular periods Reviewed past medical,surgical, social and family history. Reviewed medications and allergies.     Objective:   Physical Exam BP 122/82 (BP Location: Left Arm, Patient Position: Sitting, Cuff Size: Large)   Pulse 84   Ht 5' 5.5" (1.664 m)   Wt 283 lb 8 oz (128.6 kg)   LMP  (LMP Unknown)   BMI 46.46 kg/m UPT negative.FSH 3.0,TSH 0.868 and [place] was normal with EEC 2.8 mm.Will try provera to get withdrawal bleed and then cycle with low dose OC.    Assessment:     1. Irregular periods   2. Pregnancy examination or test, negative result       Plan:     Meds ordered this encounter  Medications  . medroxyPROGESTERone (PROVERA) 10 MG tablet    Sig: Take 1 tablet (10 mg total) by mouth daily.    Dispense:  10 tablet    Refill:  0    Order Specific Question:   Supervising Provider    Answer:   Lazaro Arms [[id number]]  Call with period or if no period, may try low dose OCs to cycle   Follow up prn

## 2017-04-20 DIAGNOSIS — M3219 Other organ or system involvement in systemic lupus erythematosus: Secondary | ICD-10-CM | POA: Diagnosis not present

## 2017-04-20 DIAGNOSIS — R600 Localized edema: Secondary | ICD-10-CM | POA: Diagnosis not present

## 2017-04-20 DIAGNOSIS — M3500 Sicca syndrome, unspecified: Secondary | ICD-10-CM | POA: Diagnosis not present

## 2017-06-16 DIAGNOSIS — H6691 Otitis media, unspecified, right ear: Secondary | ICD-10-CM | POA: Diagnosis not present

## 2017-06-16 DIAGNOSIS — Z6841 Body Mass Index (BMI) 40.0 and over, adult: Secondary | ICD-10-CM | POA: Diagnosis not present

## 2017-12-13 DIAGNOSIS — M3219 Other organ or system involvement in systemic lupus erythematosus: Secondary | ICD-10-CM | POA: Diagnosis not present

## 2017-12-13 DIAGNOSIS — M3500 Sicca syndrome, unspecified: Secondary | ICD-10-CM | POA: Diagnosis not present

## 2017-12-13 DIAGNOSIS — I7301 Raynaud's syndrome with gangrene: Secondary | ICD-10-CM | POA: Diagnosis not present

## 2017-12-13 DIAGNOSIS — Z7952 Long term (current) use of systemic steroids: Secondary | ICD-10-CM | POA: Diagnosis not present

## 2017-12-13 DIAGNOSIS — M329 Systemic lupus erythematosus, unspecified: Secondary | ICD-10-CM | POA: Diagnosis not present

## 2017-12-13 DIAGNOSIS — M3320 Polymyositis, organ involvement unspecified: Secondary | ICD-10-CM | POA: Diagnosis not present

## 2017-12-13 DIAGNOSIS — M349 Systemic sclerosis, unspecified: Secondary | ICD-10-CM | POA: Diagnosis not present

## 2017-12-13 DIAGNOSIS — Z006 Encounter for examination for normal comparison and control in clinical research program: Secondary | ICD-10-CM | POA: Diagnosis not present

## 2018-02-11 DIAGNOSIS — H8303 Labyrinthitis, bilateral: Secondary | ICD-10-CM | POA: Diagnosis not present

## 2018-02-16 DIAGNOSIS — R110 Nausea: Secondary | ICD-10-CM | POA: Diagnosis not present

## 2018-02-16 DIAGNOSIS — H6982 Other specified disorders of Eustachian tube, left ear: Secondary | ICD-10-CM | POA: Diagnosis not present

## 2018-02-16 DIAGNOSIS — H8302 Labyrinthitis, left ear: Secondary | ICD-10-CM | POA: Diagnosis not present

## 2018-02-16 DIAGNOSIS — Z6841 Body Mass Index (BMI) 40.0 and over, adult: Secondary | ICD-10-CM | POA: Diagnosis not present

## 2018-03-08 DIAGNOSIS — J0100 Acute maxillary sinusitis, unspecified: Secondary | ICD-10-CM | POA: Diagnosis not present

## 2018-03-08 DIAGNOSIS — Z6841 Body Mass Index (BMI) 40.0 and over, adult: Secondary | ICD-10-CM | POA: Diagnosis not present

## 2018-03-08 DIAGNOSIS — H6982 Other specified disorders of Eustachian tube, left ear: Secondary | ICD-10-CM | POA: Diagnosis not present

## 2018-03-08 DIAGNOSIS — R42 Dizziness and giddiness: Secondary | ICD-10-CM | POA: Diagnosis not present

## 2018-04-20 DIAGNOSIS — H5213 Myopia, bilateral: Secondary | ICD-10-CM | POA: Diagnosis not present

## 2018-04-20 DIAGNOSIS — H52223 Regular astigmatism, bilateral: Secondary | ICD-10-CM | POA: Diagnosis not present

## 2018-04-20 DIAGNOSIS — H16223 Keratoconjunctivitis sicca, not specified as Sjogren's, bilateral: Secondary | ICD-10-CM | POA: Diagnosis not present

## 2018-04-20 DIAGNOSIS — M3210 Systemic lupus erythematosus, organ or system involvement unspecified: Secondary | ICD-10-CM | POA: Diagnosis not present

## 2018-06-14 DIAGNOSIS — Z6841 Body Mass Index (BMI) 40.0 and over, adult: Secondary | ICD-10-CM | POA: Diagnosis not present

## 2018-06-14 DIAGNOSIS — H8309 Labyrinthitis, unspecified ear: Secondary | ICD-10-CM | POA: Diagnosis not present

## 2018-06-14 DIAGNOSIS — R110 Nausea: Secondary | ICD-10-CM | POA: Diagnosis not present

## 2018-07-27 DIAGNOSIS — M3500 Sicca syndrome, unspecified: Secondary | ICD-10-CM | POA: Diagnosis not present

## 2018-07-27 DIAGNOSIS — R5383 Other fatigue: Secondary | ICD-10-CM | POA: Diagnosis not present

## 2018-07-27 DIAGNOSIS — M3219 Other organ or system involvement in systemic lupus erythematosus: Secondary | ICD-10-CM | POA: Diagnosis not present

## 2018-07-27 DIAGNOSIS — M609 Myositis, unspecified: Secondary | ICD-10-CM | POA: Diagnosis not present

## 2018-07-27 DIAGNOSIS — Z79899 Other long term (current) drug therapy: Secondary | ICD-10-CM | POA: Diagnosis not present

## 2018-07-27 DIAGNOSIS — R531 Weakness: Secondary | ICD-10-CM | POA: Diagnosis not present

## 2018-07-27 DIAGNOSIS — M351 Other overlap syndromes: Secondary | ICD-10-CM | POA: Diagnosis not present

## 2018-07-27 DIAGNOSIS — M3320 Polymyositis, organ involvement unspecified: Secondary | ICD-10-CM | POA: Diagnosis not present

## 2018-07-27 DIAGNOSIS — Z23 Encounter for immunization: Secondary | ICD-10-CM | POA: Diagnosis not present

## 2018-07-27 DIAGNOSIS — M797 Fibromyalgia: Secondary | ICD-10-CM | POA: Diagnosis not present

## 2018-07-27 DIAGNOSIS — M329 Systemic lupus erythematosus, unspecified: Secondary | ICD-10-CM | POA: Diagnosis not present

## 2018-07-27 DIAGNOSIS — Z7952 Long term (current) use of systemic steroids: Secondary | ICD-10-CM | POA: Diagnosis not present

## 2018-08-27 DIAGNOSIS — R110 Nausea: Secondary | ICD-10-CM | POA: Diagnosis not present

## 2018-08-27 DIAGNOSIS — Z6841 Body Mass Index (BMI) 40.0 and over, adult: Secondary | ICD-10-CM | POA: Diagnosis not present

## 2018-08-27 DIAGNOSIS — R509 Fever, unspecified: Secondary | ICD-10-CM | POA: Diagnosis not present

## 2018-08-27 DIAGNOSIS — B349 Viral infection, unspecified: Secondary | ICD-10-CM | POA: Diagnosis not present

## 2018-09-01 DIAGNOSIS — I7300 Raynaud's syndrome without gangrene: Secondary | ICD-10-CM | POA: Diagnosis not present

## 2018-09-01 DIAGNOSIS — M329 Systemic lupus erythematosus, unspecified: Secondary | ICD-10-CM | POA: Diagnosis not present

## 2018-09-01 DIAGNOSIS — M351 Other overlap syndromes: Secondary | ICD-10-CM | POA: Diagnosis not present

## 2018-09-01 DIAGNOSIS — K3184 Gastroparesis: Secondary | ICD-10-CM | POA: Diagnosis not present

## 2018-09-05 DIAGNOSIS — R42 Dizziness and giddiness: Secondary | ICD-10-CM | POA: Diagnosis not present

## 2018-09-05 DIAGNOSIS — G43909 Migraine, unspecified, not intractable, without status migrainosus: Secondary | ICD-10-CM | POA: Diagnosis not present

## 2018-09-12 DIAGNOSIS — Z5321 Procedure and treatment not carried out due to patient leaving prior to being seen by health care provider: Secondary | ICD-10-CM | POA: Diagnosis not present

## 2018-09-12 DIAGNOSIS — R109 Unspecified abdominal pain: Secondary | ICD-10-CM | POA: Diagnosis not present

## 2018-10-13 DIAGNOSIS — H8112 Benign paroxysmal vertigo, left ear: Secondary | ICD-10-CM | POA: Diagnosis not present

## 2018-10-13 DIAGNOSIS — G43909 Migraine, unspecified, not intractable, without status migrainosus: Secondary | ICD-10-CM | POA: Diagnosis not present

## 2018-10-13 DIAGNOSIS — H9192 Unspecified hearing loss, left ear: Secondary | ICD-10-CM | POA: Diagnosis not present

## 2018-10-13 DIAGNOSIS — Z6841 Body Mass Index (BMI) 40.0 and over, adult: Secondary | ICD-10-CM | POA: Diagnosis not present

## 2018-10-17 ENCOUNTER — Ambulatory Visit (INDEPENDENT_AMBULATORY_CARE_PROVIDER_SITE_OTHER): Payer: BLUE CROSS/BLUE SHIELD | Admitting: Otolaryngology

## 2018-10-17 DIAGNOSIS — R42 Dizziness and giddiness: Secondary | ICD-10-CM

## 2018-10-17 DIAGNOSIS — H9042 Sensorineural hearing loss, unilateral, left ear, with unrestricted hearing on the contralateral side: Secondary | ICD-10-CM | POA: Diagnosis not present

## 2018-10-17 DIAGNOSIS — H8103 Meniere's disease, bilateral: Secondary | ICD-10-CM | POA: Diagnosis not present

## 2018-10-27 DIAGNOSIS — R42 Dizziness and giddiness: Secondary | ICD-10-CM | POA: Diagnosis not present

## 2018-11-02 DIAGNOSIS — R42 Dizziness and giddiness: Secondary | ICD-10-CM | POA: Diagnosis not present

## 2018-11-09 DIAGNOSIS — M3500 Sicca syndrome, unspecified: Secondary | ICD-10-CM | POA: Diagnosis not present

## 2018-11-09 DIAGNOSIS — Z79899 Other long term (current) drug therapy: Secondary | ICD-10-CM | POA: Diagnosis not present

## 2018-11-09 DIAGNOSIS — M3219 Other organ or system involvement in systemic lupus erythematosus: Secondary | ICD-10-CM | POA: Diagnosis not present

## 2018-11-09 DIAGNOSIS — Z006 Encounter for examination for normal comparison and control in clinical research program: Secondary | ICD-10-CM | POA: Diagnosis not present

## 2018-11-09 DIAGNOSIS — H903 Sensorineural hearing loss, bilateral: Secondary | ICD-10-CM | POA: Diagnosis not present

## 2018-11-10 ENCOUNTER — Ambulatory Visit (INDEPENDENT_AMBULATORY_CARE_PROVIDER_SITE_OTHER): Payer: BLUE CROSS/BLUE SHIELD | Admitting: Otolaryngology

## 2018-11-10 DIAGNOSIS — H9121 Sudden idiopathic hearing loss, right ear: Secondary | ICD-10-CM | POA: Diagnosis not present

## 2018-11-10 DIAGNOSIS — R42 Dizziness and giddiness: Secondary | ICD-10-CM

## 2018-11-11 DIAGNOSIS — G43909 Migraine, unspecified, not intractable, without status migrainosus: Secondary | ICD-10-CM | POA: Diagnosis not present

## 2018-11-11 DIAGNOSIS — M351 Other overlap syndromes: Secondary | ICD-10-CM | POA: Diagnosis not present

## 2018-11-11 DIAGNOSIS — H8112 Benign paroxysmal vertigo, left ear: Secondary | ICD-10-CM | POA: Diagnosis not present

## 2018-11-11 DIAGNOSIS — H9193 Unspecified hearing loss, bilateral: Secondary | ICD-10-CM | POA: Diagnosis not present

## 2018-11-18 DIAGNOSIS — H9121 Sudden idiopathic hearing loss, right ear: Secondary | ICD-10-CM | POA: Diagnosis not present

## 2018-11-25 DIAGNOSIS — H9121 Sudden idiopathic hearing loss, right ear: Secondary | ICD-10-CM | POA: Diagnosis not present

## 2018-12-02 DIAGNOSIS — H9121 Sudden idiopathic hearing loss, right ear: Secondary | ICD-10-CM | POA: Diagnosis not present

## 2018-12-09 DIAGNOSIS — H9121 Sudden idiopathic hearing loss, right ear: Secondary | ICD-10-CM | POA: Diagnosis not present

## 2018-12-22 DIAGNOSIS — H9193 Unspecified hearing loss, bilateral: Secondary | ICD-10-CM | POA: Diagnosis not present

## 2018-12-22 DIAGNOSIS — H8112 Benign paroxysmal vertigo, left ear: Secondary | ICD-10-CM | POA: Diagnosis not present

## 2018-12-22 DIAGNOSIS — M351 Other overlap syndromes: Secondary | ICD-10-CM | POA: Diagnosis not present

## 2018-12-22 DIAGNOSIS — G43909 Migraine, unspecified, not intractable, without status migrainosus: Secondary | ICD-10-CM | POA: Diagnosis not present

## 2019-01-19 ENCOUNTER — Ambulatory Visit (INDEPENDENT_AMBULATORY_CARE_PROVIDER_SITE_OTHER): Payer: BLUE CROSS/BLUE SHIELD | Admitting: Otolaryngology

## 2019-01-19 DIAGNOSIS — H903 Sensorineural hearing loss, bilateral: Secondary | ICD-10-CM

## 2019-01-19 DIAGNOSIS — R42 Dizziness and giddiness: Secondary | ICD-10-CM

## 2019-02-08 DIAGNOSIS — M3219 Other organ or system involvement in systemic lupus erythematosus: Secondary | ICD-10-CM | POA: Diagnosis not present

## 2019-05-17 DIAGNOSIS — M3219 Other organ or system involvement in systemic lupus erythematosus: Secondary | ICD-10-CM | POA: Diagnosis not present

## 2019-07-04 DIAGNOSIS — H5213 Myopia, bilateral: Secondary | ICD-10-CM | POA: Diagnosis not present

## 2019-07-27 ENCOUNTER — Ambulatory Visit (INDEPENDENT_AMBULATORY_CARE_PROVIDER_SITE_OTHER): Payer: BLUE CROSS/BLUE SHIELD | Admitting: Otolaryngology

## 2019-07-27 ENCOUNTER — Other Ambulatory Visit: Payer: Self-pay

## 2019-08-17 ENCOUNTER — Ambulatory Visit (INDEPENDENT_AMBULATORY_CARE_PROVIDER_SITE_OTHER): Payer: BC Managed Care – PPO | Admitting: Otolaryngology

## 2019-08-23 DIAGNOSIS — B349 Viral infection, unspecified: Secondary | ICD-10-CM | POA: Diagnosis not present

## 2019-08-23 DIAGNOSIS — Z03818 Encounter for observation for suspected exposure to other biological agents ruled out: Secondary | ICD-10-CM | POA: Diagnosis not present

## 2019-08-24 DIAGNOSIS — Z03818 Encounter for observation for suspected exposure to other biological agents ruled out: Secondary | ICD-10-CM | POA: Diagnosis not present

## 2019-08-24 DIAGNOSIS — B349 Viral infection, unspecified: Secondary | ICD-10-CM | POA: Diagnosis not present

## 2020-01-10 DIAGNOSIS — M3219 Other organ or system involvement in systemic lupus erythematosus: Secondary | ICD-10-CM | POA: Diagnosis not present

## 2020-01-10 DIAGNOSIS — Z79899 Other long term (current) drug therapy: Secondary | ICD-10-CM | POA: Diagnosis not present

## 2020-03-19 DIAGNOSIS — G43909 Migraine, unspecified, not intractable, without status migrainosus: Secondary | ICD-10-CM | POA: Diagnosis not present

## 2020-03-19 DIAGNOSIS — M351 Other overlap syndromes: Secondary | ICD-10-CM | POA: Diagnosis not present

## 2020-03-19 DIAGNOSIS — H8112 Benign paroxysmal vertigo, left ear: Secondary | ICD-10-CM | POA: Diagnosis not present

## 2020-03-19 DIAGNOSIS — H9193 Unspecified hearing loss, bilateral: Secondary | ICD-10-CM | POA: Diagnosis not present

## 2020-03-28 DIAGNOSIS — R599 Enlarged lymph nodes, unspecified: Secondary | ICD-10-CM | POA: Diagnosis not present

## 2020-03-28 DIAGNOSIS — R590 Localized enlarged lymph nodes: Secondary | ICD-10-CM | POA: Diagnosis not present

## 2020-05-15 DIAGNOSIS — M791 Myalgia, unspecified site: Secondary | ICD-10-CM | POA: Diagnosis not present

## 2020-05-15 DIAGNOSIS — R5383 Other fatigue: Secondary | ICD-10-CM | POA: Diagnosis not present

## 2020-05-15 DIAGNOSIS — M329 Systemic lupus erythematosus, unspecified: Secondary | ICD-10-CM | POA: Diagnosis not present

## 2020-05-15 DIAGNOSIS — Z111 Encounter for screening for respiratory tuberculosis: Secondary | ICD-10-CM | POA: Diagnosis not present

## 2020-05-15 DIAGNOSIS — M064 Inflammatory polyarthropathy: Secondary | ICD-10-CM | POA: Diagnosis not present

## 2020-05-22 ENCOUNTER — Encounter: Payer: Self-pay | Admitting: Neurology

## 2020-05-23 ENCOUNTER — Encounter: Payer: Self-pay | Admitting: Neurology

## 2020-05-23 ENCOUNTER — Ambulatory Visit (INDEPENDENT_AMBULATORY_CARE_PROVIDER_SITE_OTHER): Payer: BC Managed Care – PPO | Admitting: Neurology

## 2020-05-23 VITALS — BP 131/82 | HR 81 | Ht 65.5 in | Wt 258.0 lb

## 2020-05-23 DIAGNOSIS — H9193 Unspecified hearing loss, bilateral: Secondary | ICD-10-CM | POA: Diagnosis not present

## 2020-05-23 DIAGNOSIS — G43109 Migraine with aura, not intractable, without status migrainosus: Secondary | ICD-10-CM

## 2020-05-23 DIAGNOSIS — R4701 Aphasia: Secondary | ICD-10-CM

## 2020-05-23 DIAGNOSIS — G8929 Other chronic pain: Secondary | ICD-10-CM

## 2020-05-23 DIAGNOSIS — M542 Cervicalgia: Secondary | ICD-10-CM

## 2020-05-23 DIAGNOSIS — R51 Headache with orthostatic component, not elsewhere classified: Secondary | ICD-10-CM

## 2020-05-23 DIAGNOSIS — R42 Dizziness and giddiness: Secondary | ICD-10-CM

## 2020-05-23 DIAGNOSIS — R29898 Other symptoms and signs involving the musculoskeletal system: Secondary | ICD-10-CM

## 2020-05-23 DIAGNOSIS — H9313 Tinnitus, bilateral: Secondary | ICD-10-CM

## 2020-05-23 DIAGNOSIS — R519 Headache, unspecified: Secondary | ICD-10-CM

## 2020-05-23 DIAGNOSIS — H9191 Unspecified hearing loss, right ear: Secondary | ICD-10-CM | POA: Diagnosis not present

## 2020-05-23 DIAGNOSIS — M359 Systemic involvement of connective tissue, unspecified: Secondary | ICD-10-CM

## 2020-05-23 DIAGNOSIS — H539 Unspecified visual disturbance: Secondary | ICD-10-CM

## 2020-05-23 NOTE — Progress Notes (Signed)
[facility]    Provider:  Dr Lucia Gaskins Requesting Provider: Arlyss Repress Allwardt PA Primary Care Provider:  Allwardt, Alyssa, PA  CC:  migraines  HPI:  Monica Ashley is a 39 y.o. female here as requested by for migraines. I reviewed  Alyssa Allwardt PA's notes: She has a past medical history of depression, headache and lupus, obesity, elevated LFTs, other fatigue.  Patient having ongoing headaches especially right-sided and tinnitus, [facility] specialist wanted her to see neurology for consult, but patient unable to drive that far and she was sent to [place], also ongoing bilateral hearing loss worse on the right than the left. She started losing her hearing in 2020, she has had migraines for years. 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 Migraines are on one side of her head, weakness on the left side and in the arm, she can't make her words come out correctly, this is unchanged for many years, pounding/pulsating,nausea,throbbing, light/saound sensitivity, vomiting, movement makes it worse, a dark quiet room may help. She has pain in the base of her neck it burns and hurts. It shoots up, started in the last 6 weeks worsened, she has poor posture, and she is constantly rotating her neck and tightness. She has improved on Topamax, not as many, she was having 2 migraines a week and 10 or more migraine days in a month and at least daily dull headaches, she had a sleep evaluation and she does not have sleep apnea. She has vertigo. No other focal neurologic deficits, associated symptoms, inciting events or modifiable factors.  Reviewed notes, labs and imaging from outside physicians, which showed:  CT Neck Soft Tissue w contrast 03/28/2020: reviewed report:  Unremarkable appearance of the parotid and submandibular glands.   No discrete mass or inflammatory changes identified within the neck.   No pathologically enlarged  cervical chain lymph nodes.    On my review of notes medications that patient is tried that can be used in migraine management include: Meclizine, rizatriptan, promethazine, gabapentin, mirtazapine, meclizine, Topamax, Cymbalta, prednisone, amlodipine, gabapentin, Benadryl  Per review of records patient was referred to [facility] rheumatology, as well as a skill for neurologic, CBC, CMP, TSH, [place] and CT soft tissue of the neck was ordered March 19, 2020 with normal CBC, TSH 2.820 normal, CMP with BUN 5 and creatinine 0.78, slightly elevated AST 46 and sodium 145 and potassium 3.4 otherwise unremarkable  Review of Systems: Patient complains of symptoms per HPI as well as the following symptoms: migraines, hearing loss. Pertinent negatives and positives per HPI. All others negative.   Social History   Socioeconomic History  . Marital status: Married    Spouse name: Not on file  . Number of children: 3  . Years of education: Not on file  . Highest education level: Some college, no degree  Occupational History  . Not on file  Tobacco Use  . Smoking status: Never Smoker  . Smokeless tobacco: Never Used  Vaping Use  . Vaping Use: Never used  Substance and Sexual Activity  . Alcohol use: No  . Drug use: No  . Sexual activity: Yes    Birth control/protection: Condom  Other Topics Concern  . Not on file  Social History Narrative   Currently working as a Lawyer for [employer].    Lives at home with husband   Right handed   Caffeine: daily;  tea, occasional soda    Social Determinants of Health   Financial Resource Strain:   . Difficulty of Paying Living Expenses: Not on file  Food Insecurity:   . Worried About Programme researcher, broadcasting/film/videounning Out of Food in the Last Year: Not on file  . Ran Out of Food in the Last Year: Not on file  Transportation Needs:   . Lack of Transportation (Medical): Not on file  . Lack of Transportation (Non-Medical): Not on file  Physical Activity:     . Days of Exercise per Week: Not on file  . Minutes of Exercise per Session: Not on file  Stress:   . Feeling of Stress : Not on file  Social Connections:   . Frequency of Communication with Friends and Family: Not on file  . Frequency of Social Gatherings with Friends and Family: Not on file  . Attends Religious Services: Not on file  . Active Member of Clubs or Organizations: Not on file  . Attends BankerClub or Organization Meetings: Not on file  . Marital Status: Not on file  Intimate Partner Violence:   . Fear of Current or Ex-Partner: Not on file  . Emotionally Abused: Not on file  . Physically Abused: Not on file  . Sexually Abused: Not on file    Family History  Problem Relation Age of Onset  . Gout Mother   . Hypertension Mother   . Autoimmune disease Mother   . Seizures Mother   . Migraines Mother   . Arrhythmia Mother   . Gout Father   . Hypertension Paternal Grandfather   . Hyperlipidemia Paternal Grandfather   . CAD Paternal Grandfather   . Heart attack Paternal Grandfather   . Hypertension Paternal Grandmother   . Diabetes Paternal Grandmother   . Diabetes Maternal Grandmother   . Hypertension Maternal Grandmother   . CAD Maternal Grandmother   . Stroke Maternal Grandmother   . Stroke Maternal Grandfather   . Heart defect Daughter        at birth  . Deafness Son        left ear    Past Medical History:  Diagnosis Date  . Anxiety   . Bilateral hearing loss   . Depression   . Elevated LFTs   . Enlarged lymph node   . Gastroparesis   . Leukopenia   . Lupus (HCC)   . Migraine   . Obesity   . Raynaud disease   . Systemic lupus erythematosus (HCC)     Patient Active Problem List   Diagnosis Date Noted  . Migraine with aura and without status migrainosus, not intractable 05/26/2020  . Decreased libido 12/25/2016  . Dyspareunia in female 12/25/2016  . Abdominal cramps 12/25/2016  . Irregular periods 12/25/2016  . Fever 04/19/2014  . Obesity  04/19/2014  . Diarrhea 04/19/2014  . Tachycardia 04/19/2014  . Lupus [facility](HCC)     Past Surgical History:  Procedure Laterality Date  . Bladder stretched     as a child  . CESAREAN SECTION  01/12/2003  . CESAREAN SECTION  11/24/2004  . CHOLECYSTECTOMY    . TONSILLECTOMY AND ADENOIDECTOMY  2009    Current Outpatient Medications  Medication Sig Dispense Refill  . albuterol (PROVENTIL HFA;VENTOLIN HFA) 108 (90 BASE) MCG/ACT inhaler Inhale 1-2 puffs into the lungs every 6 (six) hours as needed for wheezing or shortness of breath. 1 Inhaler 0  . amLODipine (NORVASC) 5 MG tablet Take 5 mg by mouth daily.    Marland Kitchen. azaTHIOprine (  IMURAN) 50 MG tablet Take 200 mg by mouth daily.     . DULoxetine (CYMBALTA) 60 MG capsule Take 60 mg by mouth daily.    Marland Kitchen gabapentin (NEURONTIN) 300 MG capsule Take 600 mg by mouth daily.     . hydroxychloroquine (PLAQUENIL) 200 MG tablet Take 400 mg by mouth daily.     . meclizine (ANTIVERT) 25 MG tablet Take 25 mg by mouth 3 (three) times daily as needed for dizziness.    . mirtazapine (REMERON) 15 MG tablet Take 15 mg by mouth at bedtime.     Marland Kitchen omeprazole (PRILOSEC) 20 MG capsule Take 40 mg by mouth in the morning and at bedtime.     . promethazine (PHENERGAN) 25 MG tablet Take 25 mg by mouth every 6 (six) hours as needed for nausea.    . rizatriptan (MAXALT-MLT) 5 MG disintegrating tablet Take 5 mg by mouth as needed. May repeat in 2 hours if needed    . topiramate (TOPAMAX) 25 MG tablet Take 25 mg by mouth daily.     No current facility-administered medications for this visit.    Allergies as of 05/23/2020 - Review Complete 05/23/2020  Allergen Reaction Noted  . Ciprofloxacin Itching 12/08/2016  . Codeine Hives and Itching 04/19/2014  . Hydrocodone Itching 09/20/2015  . Tramadol Itching 12/08/2016    Vitals: BP 131/82 (BP Location: Right Arm, Patient Position: Sitting)   Pulse 81   Ht 5' 5.5" (1.664 m)   Wt 258 lb (117 kg)   BMI 42.28 kg/m  Last Weight:   Wt Readings from Last 1 Encounters:  05/23/20 258 lb (117 kg)   Last Height:   Ht Readings from Last 1 Encounters:  05/23/20 5' 5.5" (1.664 m)     Physical exam: Exam: Gen: NAD, conversant, well nourised, obese, well groomed                     CV: RRR, no MRG. No Carotid Bruits. No peripheral edema, warm, nontender Eyes: Conjunctivae clear without exudates or hemorrhage  Neuro: Detailed Neurologic Exam  Speech:    Speech is normal; fluent and spontaneous with normal comprehension.  Cognition:    The patient is oriented to person, place, and time;     recent and remote memory intact;     language fluent;     normal attention, concentration,     fund of knowledge Cranial Nerves:    The pupils are equal, round, and reactive to light. Attempted fundoscopy could not visualize. . Visual fields are full to finger confrontation. Extraocular movements are intact. Trigeminal sensation is intact and the muscles of mastication are normal. The face is symmetric. The palate elevates in the midline. Hearing impaired. Voice is normal. Shoulder shrug is normal. The tongue has normal motion without fasciculations.   Coordination:    Normal finger to nose   Gait:    Normal native gait  Motor Observation:    No asymmetry, no atrophy, and no involuntary movements noted. Tone:    Normal muscle tone.    Posture:    Posture is normal. normal erect    Strength:    Strength is V/V in the upper and lower limbs.      Sensation: intact to LT     Reflex Exam:  DTR's:    Deep tendon reflexes in the upper and lower extremities are symmetrical bilaterally.   Toes:    The toes are equiv bilaterally.   Clonus:  Clonus is absent.    Assessment/Plan:  39 year old patient with migraines however concerning symptoms such as significant hearing loss(eaf left ear, severely impaired right), headaches, vision changes, positional headache, autoimmune disorder, tinnitus, unilateral weakness,  subjective aphasia, vertigo she needs an MRI of the brain (never had imaging of the brain in the past) to evaluate for multiple etiologies such as  space occupying mass, vasculitis, schwannomas, chiari or intracranial hypertension (pseudotumor) or MS/demyelination or other.    MRI of the brain w/wo contrast as above: MS protocol with thin cuts through the [facility] Physical therapy [place] chronic neck pain and headaches Declines any treatment at this time  Orders Placed This Encounter  Procedures  . MR BRAIN W WO CONTRAST  . Basic Metabolic Panel  . Ambulatory referral to Physical Therapy   No orders of the defined types were placed in this encounter.   Discussed: To prevent or relieve headaches, try the following: Cool Compress. Lie down and place a cool compress on your head.  Avoid headache triggers. If certain foods or odors seem to have triggered your migraines in the past, avoid them. A headache diary might help you identify triggers.  Include physical activity in your daily routine. Try a daily walk or other moderate aerobic exercise.  Manage stress. Find healthy ways to cope with the stressors, such as delegating tasks on your to-do list.  Practice relaxation techniques. Try deep breathing, yoga, massage and visualization.  Eat regularly. Eating regularly scheduled meals and maintaining a healthy diet might help prevent headaches. Also, drink plenty of fluids.  Follow a regular sleep schedule. Sleep deprivation might contribute to headaches Consider biofeedback. With this mind-body technique, you learn to control certain bodily functions -- such as muscle tension, heart rate and blood pressure -- to prevent headaches or reduce headache pain.    Proceed to emergency room if you experience new or worsening symptoms or symptoms do not resolve, if you have new neurologic symptoms or if headache is severe, or for any concerning symptom.   Provided education and documentation from  American headache Society toolbox including articles on: chronic migraine medication overuse headache, chronic migraines, prevention of migraines, behavioral and other nonpharmacologic treatments for headache.  Discussed:  There is increased risk for stroke in women with migraine with aura and a contraindication for the combined contraceptive pill for use by women who have migraine with aura. The risk for women with migraine without aura is lower. However other risk factors like smoking are far more likely to increase stroke risk than migraine. There is a recommendation for no smoking and for the use of OCPs without estrogen such as progestogen only pills particularly for women with migraine with aura.Marland Kitchen People who have migraine headaches with auras may be 3 times more likely to have a stroke caused by a blood clot, compared to migraine patients who don't see auras. Women who take hormone-replacement therapy may be 30 percent more likely to suffer a clot-based stroke than women not taking medication containing estrogen. Other risk factors like smoking and high blood pressure may be  much more important.   Cc: Allwardt, Alyssa, PA,   Alyssa Allwardt PA   Naomie Dean, MD  [facility] [address] [place], [place] [address]  Phone [phone] Fax [phone]

## 2020-05-23 NOTE — Patient Instructions (Addendum)
MRI of the brain w/wo contrast Physical Therapy One lab  Occipital Neuralgia  Occipital neuralgia is a type of headache that causes brief episodes of very bad pain in the back of your head. Pain from occipital neuralgia may spread (radiate) to other parts of your head. These headaches may be caused by irritation of the nerves that leave your spinal cord high up in your neck, just below the base of your skull (occipital nerves). Your occipital nerves transmit sensations from the back of your head, the top of your head, and the areas behind your ears. What are the causes? This condition can occur without any known cause (primary headache syndrome). In other cases, this condition is caused by pressure on or irritation of one of the two occipital nerves. Pressure and irritation may be due to:  Muscle spasm in the neck.  Neck injury.  Wear and tear of the vertebrae in the neck (osteoarthritis).  Disease of the disks that separate the vertebrae.  Swollen blood vessels that put pressure on the occipital nerves.  Infections.  Tumors.  Diabetes. What are the signs or symptoms? This condition causes brief burning, stabbing, electric, shocking, or shooting pain which can radiate to the top of the head. It can happen on one side or both sides of the head. It can also cause:  Pain behind the eye.  Pain triggered by neck movement or hair brushing.  Scalp tenderness.  Aching in the back of the head between episodes of very bad pain.  Pain gets worse with exposure to bright lights. How is this diagnosed? There is no test that diagnoses this condition. Your health care provider may diagnose this condition based on a physical exam and your symptoms. Other tests may be done, such as:  Imaging studies of the brain and neck (cervical spine), such as an MRI or CT scan. These look for causes of pinched nerves.  Applying pressure to the nerves in the neck to try to re-create the pain.  Injection  of numbing medicine into the occipital nerve areas to see if pain goes away (diagnostic nerve block). How is this treated? Treatment for this condition may begin with simple measures, such as:  Rest.  Massage.  Applying heat or cold on the area.  Over-the-counter pain relievers. If these measures do not work, you may need other treatments, including:  Medicines, such as: ? Prescription-strength anti-inflammatory medicines. ? Muscle relaxants. ? Anti-seizure medicines, which can relieve pain. ? Antidepressants, which can relieve pain. ? Injected medicines, such as medicines that numb the area (local anesthetic) and steroids.  Pulsed radiofrequency ablation. This is when wires are implanted to deliver electrical impulses that block pain signals from the occipital nerve.  Surgery to relieve nerve pressure.  Physical therapy. Follow these instructions at home: Pain management      Avoid any activities that cause pain.  Rest when you have an attack of pain.  Try gentle massage to relieve pain.  Try a different pillow or sleeping position.  If directed, apply heat to the affected area as told by your health care provider. Use the heat source that your health care provider recommends, such as a moist heat pack or a heating pad. ? Place a towel between your skin and the heat source. ? Leave the heat on for 20-30 minutes. ? Remove the heat if your skin turns bright red. This is especially important if you are unable to feel pain, heat, or cold. You may have a greater  risk of getting burned.  If directed, apply ice to the back of the head and neck area as told by your health care provider. ? Put ice in a plastic bag. ? Place a towel between your skin and the bag. ? Leave the ice on for 20 minutes, 2-3 times per day. General instructions  Take over-the-counter and prescription medicines only as told by your health care provider.  Avoid things that make your symptoms worse,  such as bright lights.  Try to stay active. Get regular exercise that does not cause pain. Ask your health care provider to suggest safe exercises for you.  Work with a physical therapist to learn stretching exercises you can do at home.  Practice good posture.  Keep all follow-up visits as told by your health care provider. This is important. Contact a health care provider if:  Your medicine is not working.  You have new or worsening symptoms. Get help right away if:  You have very bad head pain that does not go away.  You have a sudden change in vision, balance, or speech. Summary  Occipital neuralgia is a type of headache that causes brief episodes of very bad pain in the back of your head.  Pain from occipital neuralgia may spread (radiate) to other parts of your head.  Treatment for this condition includes rest, massage, and medicines. This information is not intended to replace advice given to you by your health care provider. Make sure you discuss any questions you have with your health care provider. Document Revised: 08/17/2017 Document Reviewed: 11/05/2016 Elsevier Patient Education  2020 [employer].   There is increased risk for stroke in women with migraine with aura and a contraindication for the combined contraceptive pill for use by women who have migraine with aura. The risk for women with migraine without aura is lower. However other risk factors like smoking are far more likely to increase stroke risk than migraine. There is a recommendation for no smoking and for the use of OCPs without estrogen such as progestogen only pills particularly for women with migraine with aura.Marland Kitchen People who have migraine headaches with auras may be 3 times more likely to have a stroke caused by a blood clot, compared to migraine patients who don't see auras. Women who take hormone-replacement therapy may be 30 percent more likely to suffer a clot-based stroke than women not taking  medication containing estrogen. Other risk factors like smoking and high blood pressure may be  much more important.

## 2020-05-24 LAB — BASIC METABOLIC PANEL
BUN/Creatinine Ratio: 7 — ABNORMAL LOW (ref 9–23)
BUN: 6 mg/dL (ref 6–24)
CO2: 25 mmol/L (ref 20–29)
Calcium: 9.8 mg/dL (ref 8.7–10.2)
Chloride: 107 mmol/L — ABNORMAL HIGH (ref 96–106)
Creatinine, Ser: 0.87 mg/dL (ref 0.57–1.00)
GFR calc Af Amer: 96 mL/min/{1.73_m2} (ref >59)
GFR calc non Af Amer: 84 mL/min/{1.73_m2} (ref >59)
Glucose: 101 mg/dL — ABNORMAL HIGH (ref 65–99)
Potassium: 3.2 mmol/L — ABNORMAL LOW (ref 3.5–5.2)
Sodium: 145 mmol/L — ABNORMAL HIGH (ref 134–144)

## 2020-05-26 ENCOUNTER — Encounter: Payer: Self-pay | Admitting: Neurology

## 2020-05-26 DIAGNOSIS — G43109 Migraine with aura, not intractable, without status migrainosus: Secondary | ICD-10-CM | POA: Insufficient documentation

## 2020-05-27 ENCOUNTER — Other Ambulatory Visit: Payer: Self-pay | Admitting: Neurology

## 2020-05-27 ENCOUNTER — Telehealth: Payer: Self-pay | Admitting: Neurology

## 2020-05-27 MED ORDER — ALPRAZOLAM 0.25 MG PO TABS: ORAL_TABLET | ORAL | 0 refills | Status: DC

## 2020-05-27 NOTE — Telephone Encounter (Signed)
no to the covid questions MR Brain w/wo contrast Dr. Valaria Good Auth: [id number] (exp. 05/27/20 to 11/22/20) .   Patient is scheduled at [facility] for 05/28/20. She also informed me she needs something to help her because she is claustrophobic and she is aware to have a driver.

## 2020-05-27 NOTE — Telephone Encounter (Signed)
Noted, I informed the patient.

## 2020-05-27 NOTE — Telephone Encounter (Signed)
Let her know I sent in Xanax, it can be sedating, she will need a driver thanks

## 2020-05-28 ENCOUNTER — Ambulatory Visit: Payer: BC Managed Care – PPO

## 2020-05-28 ENCOUNTER — Other Ambulatory Visit: Payer: Self-pay

## 2020-05-28 DIAGNOSIS — R4701 Aphasia: Secondary | ICD-10-CM

## 2020-05-28 DIAGNOSIS — H539 Unspecified visual disturbance: Secondary | ICD-10-CM | POA: Diagnosis not present

## 2020-05-28 DIAGNOSIS — R51 Headache with orthostatic component, not elsewhere classified: Secondary | ICD-10-CM

## 2020-05-28 DIAGNOSIS — H9191 Unspecified hearing loss, right ear: Secondary | ICD-10-CM

## 2020-05-28 DIAGNOSIS — R29898 Other symptoms and signs involving the musculoskeletal system: Secondary | ICD-10-CM

## 2020-05-28 DIAGNOSIS — M359 Systemic involvement of connective tissue, unspecified: Secondary | ICD-10-CM

## 2020-05-28 DIAGNOSIS — H9193 Unspecified hearing loss, bilateral: Secondary | ICD-10-CM

## 2020-05-28 DIAGNOSIS — H9313 Tinnitus, bilateral: Secondary | ICD-10-CM

## 2020-05-28 DIAGNOSIS — R519 Headache, unspecified: Secondary | ICD-10-CM

## 2020-05-28 DIAGNOSIS — R42 Dizziness and giddiness: Secondary | ICD-10-CM

## 2020-05-28 MED ORDER — GADOBENATE DIMEGLUMINE 529 MG/ML IV SOLN
20.0000 mL | Freq: Once | INTRAVENOUS | Status: AC | PRN
  Administered 2020-05-28: 20 mL via INTRAVENOUS

## 2020-06-19 ENCOUNTER — Ambulatory Visit (HOSPITAL_COMMUNITY): Payer: BC Managed Care – PPO | Admitting: Physical Therapy

## 2020-07-10 ENCOUNTER — Ambulatory Visit (HOSPITAL_COMMUNITY): Payer: BC Managed Care – PPO | Attending: Neurology | Admitting: Physical Therapy

## 2021-01-31 ENCOUNTER — Other Ambulatory Visit: Payer: Self-pay | Admitting: Rheumatology

## 2021-01-31 DIAGNOSIS — R7989 Other specified abnormal findings of blood chemistry: Secondary | ICD-10-CM

## 2021-04-25 ENCOUNTER — Other Ambulatory Visit: Payer: BC Managed Care – PPO | Admitting: Adult Health

## 2022-06-08 ENCOUNTER — Encounter: Payer: Self-pay | Admitting: *Deleted

## 2022-08-27 ENCOUNTER — Encounter: Payer: Self-pay | Admitting: *Deleted

## 2023-10-02 LAB — COMPREHENSIVE METABOLIC PANEL WITH GFR: Calcium: 9.8 (ref 8.7–10.7)

## 2023-10-02 LAB — VITAMIN D 25 HYDROXY (VIT D DEFICIENCY, FRACTURES): Vit D, 25-Hydroxy: 29.3

## 2023-10-24 NOTE — Progress Notes (Signed)
Office Visit Note  Patient: Monica Ashley             Date of Birth: 1981-06-13           MRN: [id number]             PCP: Bary Leriche, PA-C Referring: Minus Breeding, PA-C Visit Date: 10/25/2023 Occupation: Office work  Subjective:  New Patient (Initial Visit) (Patient states she has joint pain in her knees, hands, and elbows. Patient states she is healing from ulcers on her fingers. Patient states she has an enlarged liver and spleen. Patient states she did have an ultrasound done. )   Discussed the use of AI scribe software for clinical note transcription with the patient, who gave verbal consent to proceed.  History of Present Illness   Monica Ashley is a 43 year old female referred from PCP office for rheumatology evaluation and management for history of lupus.  She has a history of lupus diagnosed in 2015, initially presenting with joint pain, rashes, Raynaud's phenomenon, and circulation problems. Her symptoms improved with medication, but she has not been on lupus-specific medication recently due to discontinuation of care with her previous rheumatologist. She dislikes frequent doctor visits, which impacts her medication adherence.  She experiences joint pain, particularly in her knees, making squatting and standing difficult. She has been off azathioprine and hydroxychloroquine, which she previously took along with methotrexate. Azathioprine helped her stomach issues related to nondiabetic gastroparesis. She has not taken steroids in the past month but did take them within the last six months for joint pain, describing them as a 'miracle drug' for her symptoms.  She describes a recent flare of Raynaud's phenomenon in November, resulting in a blood blister-like lesion on her finger that took a long time to heal. She was previously on amlodipine for Raynaud's but was switched to metoprolol by her cardiologist for irregular heartbeats, which she notes does not help  with Raynaud's symptoms.  She has a history of fatty liver disease and recent right-sided abdominal pain. An ultrasound conducted three weeks ago on 10/12/23 revealed fatty liver and an enlarged spleen. She has had elevated liver enzymes in the past but not to a degree that required medication changes.  She has a history of nondiabetic gastroparesis, which was severe in 2015, causing vomiting and inability to eat. Azathioprine was noted to help with her stomach symptoms. She has not reported any recent kidney involvement or concerns, although she has an ectopic left kidney.  She experiences significant fatigue, describing it as feeling like 'quicksand in my blood,' which has persisted for about a month. She works in an Program[employer], systems, which involves prolonged sitting, contributing to her musculoskeletal discomfort.   Lupus Hx +ANA, +dsDNA, +RNP Raynaud's CK eelvation Malar rash Photosensitivity Oral ulcers Serositis Sicca symptoms  DMARD Hx Methotrexate 2015 - d/c 2/2 GI intolerance AZA ? Duration HCQ ? Duration  Labs reviewed 10/2018 dsDNA 166 C3/C4 wnl  Activities of Daily Living:  Patient reports morning stiffness for 2 hours.   Patient Reports nocturnal pain.  Difficulty dressing/grooming: Denies Difficulty climbing stairs: Reports Difficulty getting out of chair: Denies Difficulty using hands for taps, buttons, cutlery, and/or writing: Reports  Review of Systems  Constitutional:  Positive for fatigue.  HENT:  Positive for mouth dryness. Negative for mouth sores.   Eyes:  Positive for dryness.  Respiratory:  Negative for shortness of breath.   Cardiovascular:  Positive for chest pain and  palpitations.  Gastrointestinal:  Positive for constipation. Negative for blood in stool and diarrhea.  Endocrine: Negative for increased urination.  Genitourinary:  Negative for involuntary urination.  Musculoskeletal:  Positive for joint pain, gait problem, joint  pain, joint swelling, myalgias, muscle weakness, morning stiffness, muscle tenderness and myalgias.  Skin:  Positive for rash, hair loss and sensitivity to sunlight. Negative for color change.  Allergic/Immunologic: Positive for susceptible to infections.  Neurological:  Positive for dizziness and headaches.  Hematological:  Positive for swollen glands.  Psychiatric/Behavioral:  Positive for depressed mood and sleep disturbance. The patient is nervous/anxious.     PMFS History:  Patient Active Problem List   Diagnosis Date Noted   Splenomegaly 10/25/2023   High risk medication use 10/25/2023   Vitamin D deficiency 10/25/2023   Bilateral shoulder pain 10/25/2023   Migraine with aura and without status migrainosus, not intractable 05/26/2020   Decreased libido 12/25/2016   Dyspareunia in female 12/25/2016   Abdominal cramps 12/25/2016   Irregular periods 12/25/2016   Fever 04/19/2014   Obesity 04/19/2014   Diarrhea 04/19/2014   Tachycardia 04/19/2014   Systemic lupus erythematosus (HCC)     Past Medical History:  Diagnosis Date   Anxiety    Bilateral hearing loss    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 ear    Depression    Elevated LFTs    Enlarged liver    Enlarged lymph node    Gastroparesis    Leukopenia    Lupus    Migraine    Obesity    Raynaud disease    Spleen enlarged    Systemic lupus erythematosus (HCC)     Family History  Problem Relation Age of Onset   Gout Mother    Hypertension Mother    Autoimmune disease Mother    Seizures Mother    Migraines Mother    Arrhythmia Mother    Gout Father    Hypertension Paternal Grandfather    Hyperlipidemia Paternal Grandfather    CAD Paternal Grandfather    Heart attack Paternal Grandfather    Hypertension Paternal Grandmother    Diabetes Paternal Grandmother    Diabetes Maternal Grandmother    Hypertension Maternal Grandmother    CAD Maternal Grandmother    Stroke Maternal Grandmother    Stroke Maternal Grandfather    Heart  defect Daughter        at birth   Deafness Son        left ear   Past Surgical History:  Procedure Laterality Date   Bladder stretched     as a child   CESAREAN SECTION  01/12/2003   CESAREAN SECTION  11/24/2004   CHOLECYSTECTOMY     TONSILLECTOMY AND ADENOIDECTOMY  2009   Social History   Social History Narrative   Currently working as a Lawyer for [employer].    Lives at home with husband   Right handed   Caffeine: daily; tea, occasional soda     There is no immunization history on file for this patient.   Objective: Vital Signs: BP 136/85 (BP Location: Right Arm, Patient Position: Sitting, Cuff Size: Large)   Pulse 69   Resp 14   Ht 5\' 6"  (1.676 m)   Wt 252 lb (114.3 kg)   BMI 40.67 kg/m    Physical Exam HENT:     Mouth/Throat:     Mouth: Mucous membranes are moist.     Pharynx: Oropharynx is clear.  Eyes:     Conjunctiva/sclera: Conjunctivae  normal.  Cardiovascular:     Rate and Rhythm: Normal rate and regular rhythm.  Pulmonary:     Effort: Pulmonary effort is normal.     Breath sounds: Normal breath sounds.  Musculoskeletal:     Right lower leg: No edema.     Left lower leg: No edema.  Lymphadenopathy:     Cervical: No cervical adenopathy.  Skin:    General: Skin is warm and dry.     Findings: No rash.     Comments: Normal appearing nailfold capillaries  Neurological:     Mental Status: She is alert.  Psychiatric:        Mood and Affect: Mood normal.      Musculoskeletal Exam:  Shoulders full ROM no swelling Elbows full ROM no tenderness or swelling Wrists full ROM no tenderness or swelling Fingers full ROM no tenderness or swelling Tenderness to pressure throughout upper back and shoulders muscular distribution, anterior cervical spine and shoulder posture at rest Knees full ROM no tenderness or swelling Ankles full ROM no tenderness or swelling     Investigation: No additional findings.  Imaging: No  results found.  Recent Labs: Lab Results  Component Value Date   WBC 6.1 10/25/2023   HGB 12.9 10/25/2023   PLT 301 10/25/2023   NA 143 10/25/2023   K 4.0 10/25/2023   CL 102 10/25/2023   CO2 31 10/25/2023   GLUCOSE 91 10/25/2023   BUN 6 (L) 10/25/2023   CREATININE 0.76 10/25/2023   BILITOT 0.4 10/25/2023   ALKPHOS 49 09/17/2015   AST 54 (H) 10/25/2023   ALT 27 10/25/2023   PROT 7.6 10/25/2023   ALBUMIN 3.6 09/17/2015   CALCIUM 10.3 (H) 10/25/2023   GFRAA 96 05/23/2020    Speciality Comments: No specialty comments available.  Procedures:  No procedures performed Allergies: Codeine, Ciprofloxacin, Hydrocodone, and Tramadol   Assessment / Plan:     Visit Diagnoses: Systemic lupus erythematosus, unspecified SLE type, unspecified organ involvement status (HCC) - Plan: Sedimentation rate, C-reactive protein, C3 and C4, Anti-DNA antibody, double-stranded, Protein / creatinine ratio, urine, CK, RNP Antibody History of SLE with joint pain, skin rashes, Raynaud's, and fatigue. Previous treatments include azathioprine, hydroxychloroquine, and methotrexate. Currently off medications. -Order blood work to assess disease activity and inflammatory markers. -Check urine for protein to screen for lupus nephritis. -Consider restarting hydroxychloroquine at 2 tablets daily if labs indicate active disease. -Consider low-dose steroids if labs indicate severe inflammation.  High risk medication use - Plan: CBC with Differential/Platelet, COMPLETE METABOLIC PANEL WITH GFR -Checking CBC and CMP medication monitoring anticipating new start with DMARD. Also screening for cytopenias or liver derangement with recent abnormal imaging  Vitamin D deficiency - Plan: VITAMIN D 25 Hydroxy (Vit-D Deficiency, Fractures) Checking vitamin D level with lupus history will benefit from supplementation if low. Discussed photoprotection.  Chronic pain of both shoulders Musculoskeletal Pain Reports pain in back,  neck, and shoulders. ROM is well preserved and pain localizes more over muscle distribution not limited to joints. Also likely related to posture and work habits. -Recommend scapular stabilization exercises and chest muscle stretches.  Raynaud's Phenomenon History of Raynaud's with recent episode of blistering on fingers. No current ischemic lesions or pitting. Normal nailfold capillaries. No sclerodactyly. -Anticipate restarting hydroxychloroquine, which may help with Raynaud's symptoms.  Fatty Liver Disease History of fatty liver disease with recent ultrasound showing enlarged spleen. -Continue monitoring liver enzyme tests. -Consider further evaluation if spleen enlargement persists, as it may indicate destructive anemia  related to lupus.  Depression and Anxiety Reports significant depression and anxiety. -Consider further evaluation and treatment as needed.     Orders: Orders Placed This Encounter  Procedures   Sedimentation rate   C-reactive protein   CBC with Differential/Platelet   COMPLETE METABOLIC PANEL WITH GFR   VITAMIN D 25 Hydroxy (Vit-D Deficiency, Fractures)   C3 and C4   Anti-DNA antibody, double-stranded   Protein / creatinine ratio, urine   CK   RNP Antibody   No orders of the defined types were placed in this encounter.    Follow-Up Instructions: Return in about 2 months (around 12/23/2023) for New pt SLE ?HCQ start f/u 2mos.   Fuller Plan, MD  Note - This record has been created using [employer].  Chart creation errors have been sought, but may not always  have been located. Such creation errors do not reflect on  the standard of medical care.

## 2023-10-25 ENCOUNTER — Ambulatory Visit: Payer: BC Managed Care – PPO | Attending: Internal Medicine | Admitting: Internal Medicine

## 2023-10-25 ENCOUNTER — Encounter: Payer: Self-pay | Admitting: Internal Medicine

## 2023-10-25 VITALS — BP 136/85 | HR 69 | Resp 14 | Ht 66.0 in | Wt 252.0 lb

## 2023-10-25 DIAGNOSIS — M329 Systemic lupus erythematosus, unspecified: Secondary | ICD-10-CM | POA: Diagnosis not present

## 2023-10-25 DIAGNOSIS — E559 Vitamin D deficiency, unspecified: Secondary | ICD-10-CM

## 2023-10-25 DIAGNOSIS — M25511 Pain in right shoulder: Secondary | ICD-10-CM

## 2023-10-25 DIAGNOSIS — M25512 Pain in left shoulder: Secondary | ICD-10-CM

## 2023-10-25 DIAGNOSIS — Z79899 Other long term (current) drug therapy: Secondary | ICD-10-CM

## 2023-10-25 DIAGNOSIS — R161 Splenomegaly, not elsewhere classified: Secondary | ICD-10-CM

## 2023-10-25 DIAGNOSIS — G8929 Other chronic pain: Secondary | ICD-10-CM

## 2023-10-25 NOTE — Patient Instructions (Signed)
 Hydroxychloroquine Tablets What is this medication? HYDROXYCHLOROQUINE (hye drox ee KLOR oh kwin) treats autoimmune conditions, such as rheumatoid arthritis and lupus. It works by slowing down an overactive immune system. It may also be used to prevent and treat malaria. It works by killing the parasite that causes malaria. It belongs to a group of medications called DMARDs. This medicine may be used for other purposes; ask your health care provider or pharmacist if you have questions. COMMON BRAND NAME(S): Plaquenil, Quineprox, SOVUNA What should I tell my care team before I take this medication? They need to know if you have any of these conditions: Diabetes Eye disease, vision problems Frequently drink alcohol G6PD deficiency Heart disease Irregular heartbeat or rhythm Kidney disease Liver disease Porphyria Psoriasis An unusual or allergic reaction to hydroxychloroquine, other medications, foods, dyes, or preservatives Pregnant or trying to get pregnant Breastfeeding How should I use this medication? Take this medication by mouth with water. Take it as directed on the prescription label. Do not cut, crush, or chew this medication. Swallow the tablets whole. Take it with food. Do not take it more than directed. Take all of this medication unless your care team tells you to stop it early. Keep taking it even if you think you are better. Take products with antacids in them at a different time of day than this medication. Take this medication 4 hours before or 4 hours after antacids. Talk to your care team if you have questions. Talk to your care team about the use of this medication in children. While this medication may be prescribed for selected conditions, precautions do apply. Overdosage: If you think you have taken too much of this medicine contact a poison control center or emergency room at once. NOTE: This medicine is only for you. Do not share this medicine with others. What if I  miss a dose? If you miss a dose, take it as soon as you can. If it is almost time for your next dose, take only that dose. Do not take double or extra doses. What may interact with this medication? Do not take this medication with any of the following: Cisapride Dronedarone Pimozide Thioridazine This medication may also interact with the following: Ampicillin Antacids Cimetidine Cyclosporine Digoxin Kaolin Medications for diabetes, such as insulin, glipizide, glyburide Medications for seizures, such as carbamazepine, phenobarbital, phenytoin Mefloquine Methotrexate Other medications that cause heart rhythm changes Praziquantel This list may not describe all possible interactions. Give your health care provider a list of all the medicines, herbs, non-prescription drugs, or dietary supplements you use. Also tell them if you smoke, drink alcohol, or use illegal drugs. Some items may interact with your medicine. What should I watch for while using this medication? Visit your care team for regular checks on your progress. Tell your care team if your symptoms do not start to get better or if they get worse. You may need blood work done while you are taking this medication. If you take other medications that can affect heart rhythm, you may need more testing. Talk to your care team if you have questions. Your vision may be tested before and during use of this medication. Tell your care team right away if you have any change in your eyesight. This medication may cause serious skin reactions. They can happen weeks to months after starting the medication. Contact your care team right away if you notice fevers or flu-like symptoms with a rash. The rash may be red or purple and then  turn into blisters or peeling of the skin. Or, you might notice a red rash with swelling of the face, lips or lymph nodes in your neck or under your arms. If you or your family notice any changes in your behavior, such as  new or worsening depression, thoughts of harming yourself, anxiety, or other unusual or disturbing thoughts, or memory loss, call your care team right away. What side effects may I notice from receiving this medication? Side effects that you should report to your care team as soon as possible: Allergic reactions--skin rash, itching, hives, swelling of the face, lips, tongue, or throat Aplastic anemia--unusual weakness or fatigue, dizziness, headache, trouble breathing, increased bleeding or bruising Change in vision Heart rhythm changes--fast or irregular heartbeat, dizziness, feeling faint or lightheaded, chest pain, trouble breathing Infection--fever, chills, cough, or sore throat Low blood sugar (hypoglycemia)--tremors or shaking, anxiety, sweating, cold or clammy skin, confusion, dizziness, rapid heartbeat Muscle injury--unusual weakness or fatigue, muscle pain, dark yellow or brown urine, decrease in amount of urine Pain, tingling, or numbness in the hands or feet Rash, fever, and swollen lymph nodes Redness, blistering, peeling, or loosening of the skin, including inside the mouth Thoughts of suicide or self-harm, worsening mood, or feelings of depression Unusual bruising or bleeding Side effects that usually do not require medical attention (report to your care team if they continue or are bothersome): Diarrhea Headache Nausea Stomach pain Vomiting This list may not describe all possible side effects. Call your doctor for medical advice about side effects. You may report side effects to FDA at 1-800-FDA-1088. Where should I keep my medication? Keep out of the reach of children and pets. Store at room temperature up to 30 degrees C (86 degrees F). Protect from light. Get rid of any unused medication after the expiration date. To get rid of medications that are no longer needed or have expired: Take the medication to a medication take-back program. Check with your pharmacy or law  enforcement to find a location. If you cannot return the medication, check the label or package insert to see if the medication should be thrown out in the garbage or flushed down the toilet. If you are not sure, ask your care team. If it is safe to put it in the trash, empty the medication out of the container. Mix the medication with cat litter, dirt, coffee grounds, or other unwanted substance. Seal the mixture in a bag or container. Put it in the trash. NOTE: This sheet is a summary. It may not cover all possible information. If you have questions about this medicine, talk to your doctor, pharmacist, or health care provider.  2024 Elsevier/Gold Standard (2022-03-09 00:00:00)

## 2023-10-26 ENCOUNTER — Telehealth: Payer: Self-pay

## 2023-10-26 NOTE — Telephone Encounter (Signed)
Patient contacted the office and inquired if she could get a review of her lab results. Advised the patient we normally discuss lab results at the new patient follow up. Patient states her new patient follow up is two months out. Please advise.

## 2023-10-27 LAB — C3 AND C4
C3 Complement: 177 mg/dL (ref 83–193)
C4 Complement: 32 mg/dL (ref 15–57)

## 2023-10-27 LAB — PROTEIN / CREATININE RATIO, URINE
Creatinine, Urine: 141 mg/dL (ref 20–275)
Protein/Creat Ratio: 121 mg/g{creat} (ref 24–184)
Protein/Creatinine Ratio: 0.121 mg/mg{creat} (ref 0.024–0.184)
Total Protein, Urine: 17 mg/dL (ref 5–24)

## 2023-10-27 LAB — CBC WITH DIFFERENTIAL/PLATELET
Absolute Lymphocytes: 1135 {cells}/uL (ref 850–3900)
Absolute Monocytes: 403 {cells}/uL (ref 200–950)
Basophils Absolute: 31 {cells}/uL (ref 0–200)
Basophils Relative: 0.5 %
Eosinophils Absolute: 153 {cells}/uL (ref 15–500)
Eosinophils Relative: 2.5 %
HCT: 41.8 % (ref 35.0–45.0)
Hemoglobin: 12.9 g/dL (ref 11.7–15.5)
MCH: 26 pg — ABNORMAL LOW (ref 27.0–33.0)
MCHC: 30.9 g/dL — ABNORMAL LOW (ref 32.0–36.0)
MCV: 84.1 fL (ref 80.0–100.0)
MPV: 10.3 fL (ref 7.5–12.5)
Monocytes Relative: 6.6 %
Neutro Abs: 4380 {cells}/uL (ref 1500–7800)
Neutrophils Relative %: 71.8 %
Platelets: 301 Thousand/uL (ref 140–400)
RBC: 4.97 Million/uL (ref 3.80–5.10)
RDW: 14.7 % (ref 11.0–15.0)
Total Lymphocyte: 18.6 %
WBC: 6.1 Thousand/uL (ref 3.8–10.8)

## 2023-10-27 LAB — RNP ANTIBODY: Ribonucleic Protein(ENA) Antibody, IgG: 3.6 AI — AB

## 2023-10-27 LAB — COMPLETE METABOLIC PANEL WITH GFR
AG Ratio: 1.2 (calc) (ref 1.0–2.5)
ALT: 27 U/L (ref 6–29)
AST: 54 U/L — ABNORMAL HIGH (ref 10–30)
Albumin: 4.2 g/dL (ref 3.6–5.1)
Alkaline phosphatase (APISO): 70 U/L (ref 31–125)
BUN/Creatinine Ratio: 8 (calc) (ref 6–22)
BUN: 6 mg/dL — ABNORMAL LOW (ref 7–25)
CO2: 31 mmol/L (ref 20–32)
Calcium: 10.3 mg/dL — ABNORMAL HIGH (ref 8.6–10.2)
Chloride: 102 mmol/L (ref 98–110)
Creat: 0.76 mg/dL (ref 0.50–0.99)
Globulin: 3.4 g/dL (ref 1.9–3.7)
Glucose, Bld: 91 mg/dL (ref 65–99)
Potassium: 4 mmol/L (ref 3.5–5.3)
Sodium: 143 mmol/L (ref 135–146)
Total Bilirubin: 0.4 mg/dL (ref 0.2–1.2)
Total Protein: 7.6 g/dL (ref 6.1–8.1)
eGFR: 100 mL/min/{1.73_m2} (ref >=60)

## 2023-10-27 LAB — VITAMIN D 25 HYDROXY (VIT D DEFICIENCY, FRACTURES): Vit D, 25-Hydroxy: 27 ng/mL — ABNORMAL LOW (ref 30–100)

## 2023-10-27 LAB — SEDIMENTATION RATE: Sed Rate: 28 mm/h — ABNORMAL HIGH (ref 0–20)

## 2023-10-27 LAB — CK: Total CK: 1165 U/L — ABNORMAL HIGH (ref 20–239)

## 2023-10-27 LAB — ANTI-DNA ANTIBODY, DOUBLE-STRANDED: ds DNA Ab: 10 [IU]/mL — ABNORMAL HIGH

## 2023-10-27 LAB — C-REACTIVE PROTEIN: CRP: 7.7 mg/L

## 2023-11-02 NOTE — Telephone Encounter (Signed)
Patient contacted the office again and left message requesting a call back with her lab results. Please review and advise. Thanks!

## 2023-11-02 NOTE — Telephone Encounter (Signed)
Patient contacted the office requesting a review of her labs because she was told Dr. Dimple Casey had planned to possibly start her on Plaquenil based on lab results. Patient states she was also advised Dr. Dimple Casey may send in a low dose prednisone and possibly do something for her spleen depending on how lab results look. Please advise.

## 2023-11-03 MED ORDER — HYDROXYCHLOROQUINE SULFATE 200 MG PO TABS
400.0000 mg | ORAL_TABLET | Freq: Every day | ORAL | 1 refills | Status: DC
Start: 2023-11-03 — End: 2023-12-23

## 2023-11-03 MED ORDER — PREDNISONE 5 MG PO TABS
ORAL_TABLET | ORAL | 0 refills | Status: AC
Start: 2023-11-03 — End: 2023-11-15

## 2023-11-03 NOTE — Addendum Note (Signed)
Addended by: Fuller Plan on: 11/03/2023 04:15 PM   Modules accepted: Orders

## 2023-11-03 NOTE — Telephone Encounter (Signed)
Patient contacted the office again and requests a call back. Note placed on Dr. Gregary Cromer desk.

## 2023-11-03 NOTE — Progress Notes (Signed)
I spoke with Monica Ashley. Results indicate active inflammation with ESR of 28 and CK of 1165. dsDNA and RNP are lower positive than in previous years but still present. Plan to restart hydroxychloroquine 400 mg daily and we can reassess tolerance and if additional DMARD needed. Also prednisone taper for 12 days for current symptoms particularly bothersome is bilateral knee pain. Vitamin D is mildly low at 27. She is taking a chewable vit D supplement daily right now. I recommended she increase this to around 2000 units, or if it is already 2000 units to double this temporarily.

## 2023-12-01 ENCOUNTER — Other Ambulatory Visit: Payer: Self-pay | Admitting: Internal Medicine

## 2023-12-01 DIAGNOSIS — M329 Systemic lupus erythematosus, unspecified: Secondary | ICD-10-CM

## 2023-12-23 ENCOUNTER — Encounter: Payer: Self-pay | Admitting: Internal Medicine

## 2023-12-23 ENCOUNTER — Ambulatory Visit: Payer: BC Managed Care – PPO | Attending: Internal Medicine | Admitting: Internal Medicine

## 2023-12-23 ENCOUNTER — Ambulatory Visit (INDEPENDENT_AMBULATORY_CARE_PROVIDER_SITE_OTHER)

## 2023-12-23 VITALS — BP 128/85 | HR 69 | Resp 14 | Ht 65.5 in | Wt 258.0 lb

## 2023-12-23 DIAGNOSIS — M329 Systemic lupus erythematosus, unspecified: Secondary | ICD-10-CM

## 2023-12-23 DIAGNOSIS — I7300 Raynaud's syndrome without gangrene: Secondary | ICD-10-CM | POA: Diagnosis not present

## 2023-12-23 DIAGNOSIS — M25552 Pain in left hip: Secondary | ICD-10-CM | POA: Diagnosis not present

## 2023-12-23 MED ORDER — AMLODIPINE BESYLATE 5 MG PO TABS: 5.0000 mg | ORAL_TABLET | Freq: Every day | ORAL | 0 refills | Status: DC

## 2023-12-23 MED ORDER — HYDROXYCHLOROQUINE SULFATE 200 MG PO TABS: 400.0000 mg | ORAL_TABLET | Freq: Every day | ORAL | 0 refills | Status: DC

## 2023-12-23 NOTE — Patient Instructions (Addendum)
 Femoroacetabular impingement    Hip Exercises These exercises warm up your muscles and joints and improve the movement and flexibility of your hip. They also help to relieve pain, numbness, and tingling. You may be asked to limit your range of motion if you had a hip replacement. Talk to your provider about these limits. Hip rotation  Lie on your back on a firm surface. With your left / right hand, gently pull your left / right knee toward the shoulder that is on the same side of the body. Stop when your knee is pointing toward the ceiling. Hold your left / right ankle with your other hand. Keeping your knee steady, gently pull your left / right ankle toward your other shoulder until you feel a stretch in your butt. Keep your hips and shoulders firmly planted while you do this stretch. Hold this position for __________ seconds. Repeat __________ times. Complete this exercise __________ times a day. Seated stretch This exercise is sometimes called hamstrings and adductors stretch. Sit on the floor with your legs stretched wide. Keep your knees straight during this exercise. Keeping your head and back in a straight line, bend at your waist to reach for your left foot (position A). You should feel a stretch in your right inner thigh (adductors). Hold this position for __________ seconds. Then slowly return to the upright position. Keeping your head and back in a straight line, bend at your waist to reach forward (position B). You should feel a stretch behind both of your thighs and knees (hamstrings). Hold this position for __________ seconds. Then slowly return to the upright position. Keeping your head and back in a straight line, bend at your waist to reach for your right foot (position C). You should feel a stretch in your left inner thigh (adductors). Hold this position for __________ seconds. Then slowly return to the upright position. Repeat __________ times. Complete this exercise  __________ times a day. Lunge This exercise stretches the muscles of the hip (hip flexors). Place your left / right knee on the floor and bend your other knee so that is directly over your ankle. You should be half-kneeling. Keep good posture with your head over your shoulders. Tighten your butt muscles to point your tailbone downward. This will prevent your back from arching too much. You should feel a gentle stretch in the front of your left / right thigh and hip. If you do not feel a stretch, slide your other foot forward slightly and then slowly lunge forward with your chest up until your knee once again lines up over your ankle. Make sure your tailbone continues to point downward. Hold this position for __________ seconds. Slowly return to the starting position. Repeat __________ times. Complete this exercise __________ times a day. Strengthening exercises These exercises build strength and endurance in your hip. Endurance is the ability to use your muscles for a long time, even after they get tired. Bridge This exercise strengthens the muscles of your hip (hip extensors). Lie on your back on a firm surface with your knees bent and your feet flat on the floor. Tighten your butt muscles and lift your bottom off the floor until the trunk of your body and your hips are level with your thighs. Do not arch your back. You should feel the muscles working in your butt and the back of your thighs. If you do not feel these muscles, slide your feet 1-2 inches (2.5-5 cm) farther away from your butt. Hold this  position for __________ seconds. Slowly lower your hips to the starting position. Let your muscles relax completely between repetitions. Repeat __________ times. Complete this exercise __________ times a day. Straight leg raises, side-lying This exercise strengthens the muscles that move the hip joint away from the center of the body (hip abductors). Lie on your side with your left / right leg  in the top position. Lie so your head, shoulder, hip, and knee line up. You may bend your bottom knee slightly to help you balance. Roll your hips slightly forward, so your hips are stacked directly over each other and your left / right knee is facing forward. Leading with your heel, lift your top leg 4-6 inches (10-15 cm). You should feel the muscles in your top hip lifting. Do not let your foot drift forward. Do not let your knee roll toward the ceiling. Hold this position for __________ seconds. Slowly return to the starting position. Let your muscles relax completely between repetitions. Repeat __________ times. Complete this exercise __________ times a day. Straight leg raises, side-lying This exercise strengthens the muscles that move the hip joint toward the center of the body (hip adductors). Lie on your side with your left / right leg in the bottom position. Lie so your head, shoulder, hip, and knee line up. You may place your upper foot in front to help you balance. Roll your hips slightly forward, so your hips are stacked directly over each other and your left / right knee is facing forward. Tense the muscles in your inner thigh and lift your bottom leg 4-6 inches (10-15 cm). Hold this position for __________ seconds. Slowly return to the starting position. Let your muscles relax completely between repetitions. Repeat __________ times. Complete this exercise __________ times a day. Straight leg raises, supine This exercise strengthens the muscles in the front of your thigh (quadriceps and hip flexors). Lie on your back (supine position) with your left / right leg extended and your other knee bent. Tense the muscles in the front of your left / right thigh. You should see your kneecap slide up or see increased dimpling just above your knee. Keep these muscles tight as you raise your leg 4-6 inches (10-15 cm) off the floor. Do not let your knee bend. Hold this position for __________  seconds. Keep these muscles tense as you lower your leg. Relax the muscles slowly and completely between repetitions. Repeat __________ times. Complete this exercise __________ times a day.

## 2023-12-23 NOTE — Progress Notes (Signed)
 Office Visit Note  Patient: Monica Ashley             Date of Birth: 01-31-1981           MRN: [id number]             PCP: Bary Leriche, PA-C Referring: Allwardt, Crist Infante, PA-C Visit Date: 12/23/2023   Subjective:  Follow-up (Patient states she would like to talk about the amlodipine. Patient states she is having a lot of left hip pain and is limping. Patient states getting up and down is a problem. )   Discussed the use of AI scribe software for clinical note transcription with the patient, who gave verbal consent to proceed.  History of Present Illness   Monica Ashley is a 43 year old female with lupus on HCQ 400 mg daily who presents with continued left hip pain and Raynaud's symptoms.  She has been experiencing hip pain for about a week, described as a 'catch' in the hip when walking or getting up and down. This pain has caused limping, especially noticeable when walking in the parking lot at work or at [employer]. She has increased her walking to alleviate discomfort, as she spends much of her day sitting at a computer. There have been no recent falls or injuries. The pain is localized to the hip and does not radiate down the leg, differing from previous sciatic nerve issues. No imaging studies have been done specifically for the hip. No significant pain when pressing on the hip, but soreness is noted on the side of the hip. No significant pain while sleeping, though rolling over can sometimes cause discomfort.  She reports worsening symptoms of Raynaud's phenomenon, with finger discoloration occurring four to five times a day, often triggered by cold environments. She has not been on any medication for Raynaud's recently, as she did not have a prescription for amlodipine, which she previously took at a dose of 5 mg without issues. Her Raynaud's symptoms have been particularly bothersome over the past year. She has a history of premature heartbeats, for which she  underwent cardiac testing and was switched from amlodipine to metoprolol. She notes that metoprolol may have worsened her Raynaud's symptoms.  Her lupus is currently active, as indicated by high double-stranded DNA markers and elevated sedimentation rate. She experiences facial redness, which she attributes to sun exposure, and uses Neutrogena SPF 100 for protection. She typically wears long sleeves even in hot weather to protect her skin.       Previous HPI 10/25/23 Monica Ashley is a 43 year old female referred from PCP office for rheumatology evaluation and management for history of lupus.   She has a history of lupus diagnosed in 2015, initially presenting with joint pain, rashes, Raynaud's phenomenon, and circulation problems. Her symptoms improved with medication, but she has not been on lupus-specific medication recently due to discontinuation of care with her previous rheumatologist. She dislikes frequent doctor visits, which impacts her medication adherence.   She experiences joint pain, particularly in her knees, making squatting and standing difficult. She has been off azathioprine and hydroxychloroquine, which she previously took along with methotrexate. Azathioprine helped her stomach issues related to nondiabetic gastroparesis. She has not taken steroids in the past month but did take them within the last six months for joint pain, describing them as a 'miracle drug' for her symptoms.   She describes a recent flare of Raynaud's phenomenon in November, resulting in a  blood blister-like lesion on her finger that took a long time to heal. She was previously on amlodipine for Raynaud's but was switched to metoprolol by her cardiologist for irregular heartbeats, which she notes does not help with Raynaud's symptoms.   She has a history of fatty liver disease and recent right-sided abdominal pain. An ultrasound conducted three weeks ago on 10/12/23 revealed fatty liver and an enlarged  spleen. She has had elevated liver enzymes in the past but not to a degree that required medication changes.   She has a history of nondiabetic gastroparesis, which was severe in 2015, causing vomiting and inability to eat. Azathioprine was noted to help with her stomach symptoms. She has not reported any recent kidney involvement or concerns, although she has an ectopic left kidney.   She experiences significant fatigue, describing it as feeling like 'quicksand in my blood,' which has persisted for about a month. She works in an Program[employer], systems, which involves prolonged sitting, contributing to her musculoskeletal discomfort.    Lupus Hx +ANA, +dsDNA, +RNP Raynaud's CK eelvation Malar rash Photosensitivity Oral ulcers Serositis Sicca symptoms   DMARD Hx Methotrexate 2015 - d/c 2/2 GI intolerance AZA ? Duration HCQ ? Duration   Labs reviewed 10/2018 dsDNA 166 C3/C4 wnl   Review of Systems  Constitutional:  Positive for fatigue.  HENT:  Positive for mouth sores and mouth dryness.   Eyes:  Positive for dryness.  Respiratory:  Negative for shortness of breath.   Cardiovascular:  Positive for palpitations. Negative for chest pain.  Gastrointestinal:  Negative for blood in stool, constipation and diarrhea.  Endocrine: Positive for increased urination.  Genitourinary:  Negative for involuntary urination.  Musculoskeletal:  Positive for joint pain, gait problem, joint pain, joint swelling, myalgias, muscle weakness, morning stiffness, muscle tenderness and myalgias.  Skin:  Positive for rash and sensitivity to sunlight. Negative for color change and hair loss.  Allergic/Immunologic: Positive for susceptible to infections.  Neurological:  Positive for dizziness and headaches.  Hematological:  Positive for swollen glands.  Psychiatric/Behavioral:  Positive for depressed mood and sleep disturbance. The patient is nervous/anxious.     PMFS History:  Patient Active  Problem List   Diagnosis Date Noted   Splenomegaly 10/25/2023   High risk medication use 10/25/2023   Vitamin D deficiency 10/25/2023   Bilateral shoulder pain 10/25/2023   Migraine with aura and without status migrainosus, not intractable 05/26/2020   Decreased libido 12/25/2016   Dyspareunia in female 12/25/2016   Abdominal cramps 12/25/2016   Irregular periods 12/25/2016   Fever 04/19/2014   Obesity 04/19/2014   Diarrhea 04/19/2014   Tachycardia 04/19/2014   Systemic lupus erythematosus (HCC)     Past Medical History:  Diagnosis Date   Anxiety    Bilateral hearing loss    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 ear    Depression    Elevated LFTs    Enlarged liver    Enlarged lymph node    Gastroparesis    Leukopenia    Lupus    Migraine    Obesity    Raynaud disease    Spleen enlarged    Systemic lupus erythematosus (HCC)     Family History  Problem Relation Age of Onset   Gout Mother    Hypertension Mother    Autoimmune disease Mother    Seizures Mother    Migraines Mother    Arrhythmia Mother    Gout Father    Hypertension Paternal Actor  Hyperlipidemia Paternal Grandfather    CAD Paternal Grandfather    Heart attack Paternal Grandfather    Hypertension Paternal Grandmother    Diabetes Paternal Grandmother    Diabetes Maternal Grandmother    Hypertension Maternal Grandmother    CAD Maternal Grandmother    Stroke Maternal Grandmother    Stroke Maternal Grandfather    Heart defect Daughter        at birth   Deafness Son        left ear   Past Surgical History:  Procedure Laterality Date   Bladder stretched     as a child   CESAREAN SECTION  01/12/2003   CESAREAN SECTION  11/24/2004   CHOLECYSTECTOMY     TONSILLECTOMY AND ADENOIDECTOMY  2009   Social History   Social History Narrative   Currently working as a Lawyer for [employer].    Lives at home with husband   Right handed   Caffeine: daily; tea, occasional soda     There  is no immunization history on file for this patient.   Objective: Vital Signs: BP 128/85 (BP Location: Left Arm, Patient Position: Sitting, Cuff Size: Large)   Pulse 69   Resp 14   Ht 5' 5.5" (1.664 m)   Wt 258 lb (117 kg)   BMI 42.28 kg/m    Physical Exam Constitutional:      Appearance: She is obese.  Eyes:     Conjunctiva/sclera: Conjunctivae normal.  Cardiovascular:     Rate and Rhythm: Normal rate and regular rhythm.  Pulmonary:     Effort: Pulmonary effort is normal.     Breath sounds: Normal breath sounds.  Lymphadenopathy:     Cervical: No cervical adenopathy.  Skin:    General: Skin is warm and dry.  Neurological:     Mental Status: She is alert.  Psychiatric:        Mood and Affect: Mood normal.      Musculoskeletal Exam:  Shoulders full ROM no swelling Elbows full ROM no tenderness or swelling Wrists full ROM no tenderness or swelling Fingers full ROM no tenderness or swelling Tenderness to pressure throughout upper back and shoulders muscular distribution, anterior cervical spine and shoulder posture at rest Left hip pain with FADIR ROM, no focal tenderness to pressure Knees full ROM no tenderness or swelling Ankles full ROM no tenderness or swelling  Investigation: No additional findings.  Imaging: XR HIP UNILAT W OR W/O PELVIS 2-3 VIEWS LEFT Result Date: 12/23/2023 Xray hips AP and left lateral SI joints are patent bilaterally without significant joint space narrowing widening or any visible erosions.  Hip joint spaces appear preserved bilaterally.  There appears to be mild marginal bone spurring or overhang of the acetabulum at both sites.  No visible erosions or other abnormal bony calcification. 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  Procedures:  No procedures performed Allergies: Codeine, Ciprofloxacin, Hydrocodone, and Tramadol   Assessment / Plan:     Visit Diagnoses: Systemic lupus erythematosus, unspecified SLE type, unspecified organ involvement status (HCC) - Plan: Sedimentation rate, CK, Anti-DNA antibody, double-stranded, CBC with Differential/Platelet, hydroxychloroquine (PLAQUENIL) 200 MG tablet Active lupus with elevated double-stranded DNA and sedimentation rate. Hip pain and Raynaud's phenomenon potentially related to lupus. Advised sun protection due to photosensitivity. - Recheck blood tests to monitor inflammatory markers. - Prescribe amlodipine for Raynaud's phenomenon. - Continue hydroxychloroquine unless blood test results indicate otherwise. - Advise use of sun protection with SPF 30 or greater.  Pain in left hip - Plan: XR HIP UNILAT W OR W/O PELVIS 2-3 VIEWS LEFT Recent hip pain with catching sensation. Differential includes muscular issues, osteoarthritis, bone spurs, or impingement. Good internal rotation suggests no severe structural problem. Considering lupus, concern for AVN or early structural issues, but current examination does not indicate severe problems. - Order x-ray of the hip to assess for osteoarthritis, bone spurs, or impingement.   Raynaud's syndrome without gangrene - Plan: amLODipine (NORVASC) 5 MG tablet Raynaud's occurring four to five times daily. Amlodipine previously improved symptoms. Metoprolol may exacerbate Raynaud's. Advised to monitor blood pressure due to  hypotensive effects of amlodipine. - Prescribe amlodipine 5 mg for Raynaud's phenomenon. - Advise monitoring blood pressure daily for two weeks when resuming amlodipine   Orders: Orders Placed This Encounter  Procedures   XR HIP UNILAT W OR W/O PELVIS 2-3 VIEWS LEFT   Sedimentation rate   CK   Anti-DNA antibody, double-stranded   CBC with Differential/Platelet   Meds ordered this encounter  Medications   amLODipine (NORVASC) 5 MG tablet    Sig: Take 1 tablet (5 mg total) by mouth daily.    Dispense:  90 tablet    Refill:  0   hydroxychloroquine (PLAQUENIL) 200 MG tablet    Sig: Take 2 tablets (400 mg total) by mouth daily.    Dispense:  180 tablet    Refill:  0     Follow-Up Instructions: Return in about 3 months (around 03/23/2024) for SLE on HCQ f/u 3mos.   Fuller Plan, MD  Note - This record has been created using [employer].  Chart creation errors have been sought, but may not always  have been located. Such creation errors do not reflect on  the standard of medical care.

## 2023-12-24 LAB — CBC WITH DIFFERENTIAL/PLATELET
Absolute Lymphocytes: 1283 {cells}/uL (ref 850–3900)
Absolute Monocytes: 533 {cells}/uL (ref 200–950)
Basophils Absolute: 23 {cells}/uL (ref 0–200)
Basophils Relative: 0.3 %
Eosinophils Absolute: 263 {cells}/uL (ref 15–500)
Eosinophils Relative: 3.5 %
HCT: 37.5 % (ref 35.0–45.0)
Hemoglobin: 12.2 g/dL (ref 11.7–15.5)
MCH: 26.8 pg — ABNORMAL LOW (ref 27.0–33.0)
MCHC: 32.5 g/dL (ref 32.0–36.0)
MCV: 82.2 fL (ref 80.0–100.0)
MPV: 9.8 fL (ref 7.5–12.5)
Monocytes Relative: 7.1 %
Neutro Abs: 5400 {cells}/uL (ref 1500–7800)
Neutrophils Relative %: 72 %
Platelets: 289 10*3/uL (ref 140–400)
RBC: 4.56 10*6/uL (ref 3.80–5.10)
RDW: 15.3 % — ABNORMAL HIGH (ref 11.0–15.0)
Total Lymphocyte: 17.1 %
WBC: 7.5 10*3/uL (ref 3.8–10.8)

## 2023-12-24 LAB — CK: Total CK: 962 U/L — ABNORMAL HIGH (ref 20–239)

## 2023-12-24 LAB — ANTI-DNA ANTIBODY, DOUBLE-STRANDED: ds DNA Ab: 9 [IU]/mL — ABNORMAL HIGH

## 2023-12-24 LAB — SEDIMENTATION RATE: Sed Rate: 33 mm/h — ABNORMAL HIGH (ref 0–20)

## 2024-01-17 ENCOUNTER — Telehealth: Payer: Self-pay | Admitting: *Deleted

## 2024-01-17 NOTE — Telephone Encounter (Signed)
 Patient contacted the office requesting lab results. Please review and advise.

## 2024-02-08 ENCOUNTER — Telehealth: Payer: Self-pay | Admitting: Internal Medicine

## 2024-02-08 NOTE — Telephone Encounter (Signed)
 CD mailed, x-ray report faxed.

## 2024-02-08 NOTE — Telephone Encounter (Signed)
 Hilary from [facility] called stating Ruble is a mutual patient and are requesting requesting x-rays that she had with Dr. Rodell Citrin on 12/23/23.  Pearletha Bouche states she is able to view office notes in care everywhere, but not the x-rays.  Please fax the written report today if possible to #[phone]  Please mail disc with x-rays to: Dr. Alveda Aures  [address] [place], [place] [address]

## 2024-03-13 NOTE — Progress Notes (Signed)
 Office Visit Note  Patient: Monica Ashley             Date of Birth: Feb 04, 1981           MRN: [id number]             PCP: Kathrene Mardy HERO, PA-C Referring: Allwardt, Mardy HERO, PA-C Visit Date: 03/24/2024   Subjective:  Follow-up (Patient states she would like to talk about medication.)   Discussed the use of AI scribe software for clinical note transcription with the patient, who gave verbal consent to proceed.  History of Present Illness   Monica Ashley is a 43 y.o. female here for follow up with lupus on HCQ 400 mg daily    She experiences significant joint pain affecting her elbows, hips, hands, and feet, describing it as feeling like 'concrete in everything.' The pain severity led to a referral to orthopedics. She is scheduled to start physical therapy next week.  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 The fatigue is described as 'terrible' and has persisted over the last few months.  No recent viral illnesses or changes in sun exposure. There have been no significant changes in her routine or environment that could account for her symptoms. She reports swelling in her hands and some soreness in her lower back. She uses sunscreen consistently, specifically Neutrogena with high SPF.      Previous HPI 12/23/2023 Monica Ashley is a 43 year old female with lupus on HCQ 400 mg daily who presents with continued left hip pain and Raynaud's symptoms.   She has been experiencing hip pain for about a week, described as a 'catch' in the hip when walking or getting up and down. This pain has caused limping, especially noticeable when walking in the parking lot at work or at [employer]. She has increased her walking to alleviate discomfort, as she spends much of her day sitting at a computer. There have been no recent falls or injuries. The pain is localized to the hip and does not  radiate down the leg, differing from previous sciatic nerve issues. No imaging studies have been done specifically for the hip. No significant pain when pressing on the hip, but soreness is noted on the side of the hip. No significant pain while sleeping, though rolling over can sometimes cause discomfort.   She reports worsening symptoms of Raynaud's phenomenon, with finger discoloration occurring four to five times a day, often triggered by cold environments. She has not been on any medication for Raynaud's recently, as she did not have a prescription for amlodipine , which she previously took at a dose of 5 mg without issues. Her Raynaud's symptoms have been particularly bothersome over the past year. She has a history of premature heartbeats, for which she underwent cardiac testing and was switched from amlodipine  to metoprolol. She notes that metoprolol may have worsened her Raynaud's symptoms.   Her lupus is currently active, as indicated by high double-stranded DNA markers and elevated sedimentation rate. She experiences facial redness, which she attributes to sun exposure, and uses Neutrogena SPF 100 for protection. She typically wears long sleeves even in hot weather to protect her skin.         Previous HPI 10/25/23 Monica Ashley is a 43 year old female referred from PCP office for rheumatology evaluation and management for history of lupus.   She has  a history of lupus diagnosed in 2015, initially presenting with joint pain, rashes, Raynaud's phenomenon, and circulation problems. Her symptoms improved with medication, but she has not been on lupus-specific medication recently due to discontinuation of care with her previous rheumatologist. She dislikes frequent doctor visits, which impacts her medication adherence.   She experiences joint pain, particularly in her knees, making squatting and standing difficult. She has been off azathioprine  and hydroxychloroquine , which she previously took  along with methotrexate. Azathioprine  helped her stomach issues related to nondiabetic gastroparesis. She has not taken steroids in the past month but did take them within the last six months for joint pain, describing them as a 'miracle drug' for her symptoms.   She describes a recent flare of Raynaud's phenomenon in November, resulting in a blood blister-like lesion on her finger that took a long time to heal. She was previously on amlodipine  for Raynaud's but was switched to metoprolol by her cardiologist for irregular heartbeats, which she notes does not help with Raynaud's symptoms.   She has a history of fatty liver disease and recent right-sided abdominal pain. An ultrasound conducted three weeks ago on 10/12/23 revealed fatty liver and an enlarged spleen. She has had elevated liver enzymes in the past but not to a degree that required medication changes.   She has a history of nondiabetic gastroparesis, which was severe in 2015, causing vomiting and inability to eat. Azathioprine  was noted to help with her stomach symptoms. She has not reported any recent kidney involvement or concerns, although she has an ectopic left kidney.   She experiences significant fatigue, describing it as feeling like 'quicksand in my blood,' which has persisted for about a month. She works in an Program[employer], systems, which involves prolonged sitting, contributing to her musculoskeletal discomfort.    Lupus Hx +ANA, +dsDNA, +RNP Raynaud's CK eelvation Malar rash Photosensitivity Oral ulcers Serositis Sicca symptoms   DMARD Hx Methotrexate 2015 - d/c 2/2 GI intolerance AZA ? Duration HCQ ? Duration   Labs reviewed 10/2018 dsDNA 166 C3/C4 wnl   Review of Systems  Constitutional:  Positive for fatigue.  HENT:  Positive for mouth sores and mouth dryness.   Eyes:  Positive for dryness.  Respiratory:  Negative for shortness of breath.   Cardiovascular:  Positive for chest pain. Negative for  palpitations.  Gastrointestinal:  Positive for diarrhea. Negative for blood in stool and constipation.  Endocrine: Positive for increased urination.  Genitourinary:  Negative for involuntary urination.  Musculoskeletal:  Positive for joint pain, gait problem, joint pain, joint swelling, myalgias, muscle weakness, morning stiffness, muscle tenderness and myalgias.  Skin:  Positive for color change, rash and sensitivity to sunlight. Negative for hair loss.  Allergic/Immunologic: Positive for susceptible to infections.  Neurological:  Positive for dizziness and headaches.  Hematological:  Positive for swollen glands.  Psychiatric/Behavioral:  Positive for depressed mood and sleep disturbance. The patient is nervous/anxious.     PMFS History:  Patient Active Problem List   Diagnosis Date Noted   Other fatigue 03/24/2024   Pain in left hip 03/24/2024   Splenomegaly 10/25/2023   High risk medication use 10/25/2023   Vitamin D  deficiency 10/25/2023   Bilateral shoulder pain 10/25/2023   Migraine with aura and without status migrainosus, not intractable 05/26/2020   Decreased libido 12/25/2016   Dyspareunia in female 12/25/2016   Abdominal cramps 12/25/2016   Irregular periods 12/25/2016   Fever 04/19/2014   Obesity 04/19/2014   Diarrhea 04/19/2014  Tachycardia 04/19/2014   Systemic lupus erythematosus (HCC)     Past Medical History:  Diagnosis Date   Anxiety    Bilateral hearing loss    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 ear    Depression    Elevated LFTs    Enlarged liver    Enlarged lymph node    Gastroparesis    Leukopenia    Lupus    Migraine    Obesity    Raynaud disease    Spleen enlarged    Systemic lupus erythematosus (HCC)     Family History  Problem Relation Age of Onset   Gout Mother    Hypertension Mother    Autoimmune disease Mother    Seizures Mother    Migraines Mother    Arrhythmia Mother    Gout Father    Hypertension Paternal Grandfather    Hyperlipidemia  Paternal Grandfather    CAD Paternal Grandfather    Heart attack Paternal Grandfather    Hypertension Paternal Grandmother    Diabetes Paternal Grandmother    Diabetes Maternal Grandmother    Hypertension Maternal Grandmother    CAD Maternal Grandmother    Stroke Maternal Grandmother    Stroke Maternal Grandfather    Heart defect Daughter        at birth   Deafness Son        left ear   Past Surgical History:  Procedure Laterality Date   Bladder stretched     as a child   CESAREAN SECTION  01/12/2003   CESAREAN SECTION  11/24/2004   CHOLECYSTECTOMY     TONSILLECTOMY AND ADENOIDECTOMY  2009   Social History   Social History Narrative   Currently working as a Lawyer for [employer].    Lives at home with husband   Right handed   Caffeine: daily; tea, occasional soda     There is no immunization history on file for this patient.   Objective: Vital Signs: BP 118/83 (BP Location: Left Arm, Patient Position: Sitting, Cuff Size: Large)   Pulse 70   Resp 14   Ht 5' 5.5 (1.664 m)   Wt 259 lb (117.5 kg)   BMI 42.44 kg/m    Physical Exam Constitutional:      Appearance: She is obese.  HENT:     Mouth/Throat:     Mouth: Mucous membranes are moist.     Pharynx: Oropharynx is clear.  Eyes:     Conjunctiva/sclera: Conjunctivae normal.  Cardiovascular:     Rate and Rhythm: Normal rate and regular rhythm.  Pulmonary:     Effort: Pulmonary effort is normal.     Breath sounds: Normal breath sounds.  Musculoskeletal:     Right lower leg: No edema.     Left lower leg: No edema.  Lymphadenopathy:     Cervical: No cervical adenopathy.  Skin:    General: Skin is warm and dry.     Findings: No rash.  Neurological:     Mental Status: She is alert.  Psychiatric:        Mood and Affect: Mood normal.      Musculoskeletal Exam:  Shoulders full ROM no tenderness or swelling Mild traps and base of neck paraspinal muscles tenderness Elbows full  ROM no tenderness or swelling Wrists full ROM no tenderness or swelling Fingers full ROM no tenderness or swelling No paraspinal tenderness to palpation over upper and lower back Left lateral hip tenderness to pressure, worst at greater trochanter and up to  iliac crest, minimal pain with internal or external rotation Knees full ROM no tenderness or swelling Ankles full ROM no tenderness or swelling   Investigation: No additional findings.  Imaging: No results found.  Recent Labs: Lab Results  Component Value Date   WBC 7.5 12/23/2023   HGB 12.2 12/23/2023   PLT 289 12/23/2023   NA 143 10/25/2023   K 4.0 10/25/2023   CL 102 10/25/2023   CO2 31 10/25/2023   GLUCOSE 91 10/25/2023   BUN 6 (L) 10/25/2023   CREATININE 0.76 10/25/2023   BILITOT 0.4 10/25/2023   ALKPHOS 49 09/17/2015   AST 54 (H) 10/25/2023   ALT 27 10/25/2023   PROT 7.6 10/25/2023   ALBUMIN 3.6 09/17/2015   CALCIUM 10.3 (H) 10/25/2023   GFRAA 96 05/23/2020    Speciality Comments: PLQ Eye Exam: Scheduled for September  Procedures:  No procedures performed Allergies: Codeine, Ciprofloxacin , Hydrocodone , and Tramadol    Assessment / Plan:     Visit Diagnoses: Systemic lupus erythematosus, unspecified SLE type, unspecified organ involvement status (HCC) - Plan: Sedimentation rate, CK, Anti-DNA antibody, double-stranded, hydroxychloroquine  (PLAQUENIL ) 200 MG tablet, azaTHIOprine  (IMURAN ) 50 MG tablet Active inflammation indicated by abnormal labs. Hydroxychloroquine  insufficient. Azathioprine  effective previously but requires monitoring due to elevated AST. - Continue HCQ 400 mg daily - Resume azathioprine  starting 50 mg daily, then increase to two pills per day if tolerated. - Conduct lab visit in four weeks to check labs before increasing azathioprine  dose.  High risk medication use - Hydroxychloroquine  400 mg daily, needs a PLQ eye exam. - Plan: CBC with Differential/Platelet, Comprehensive metabolic panel  with GFR Discussed risks of azathioprine  including  liver toxicity w/ history of fatty liver disease, cytopenias, infection, malignancy. Previously well tolerate medication for her that was stopped due to lack of f/u for labs. - Checking CBC and cMP for medication monitoring on HCQ and starting AZA  Raynaud's syndrome without gangrene - Plan: amLODipine  (NORVASC ) 5 MG tablet - Continue amlodipine  5 mg daily  Fatigue related to autoimmune disease Severe fatigue likely multifactorial, exacerbated by inflammation. Improvement expected with inflammation control.  Elevated liver enzymes Slightly elevated AST likely due to fatty liver. Monitoring necessary with azathioprine  reintroduction. - Monitor liver function tests periodically, especially with azathioprine  treatment.  Tendonitis of hip region Inflammation of quads and gluteal tendons. Small labral tear, not displaced. Physical therapy first line of treatment. - Starting physical therapy next week to address labral tear.    Orders: Orders Placed This Encounter  Procedures   Sedimentation rate   CK   Anti-DNA antibody, double-stranded   CBC with Differential/Platelet   Comprehensive metabolic panel with GFR   Meds ordered this encounter  Medications   hydroxychloroquine  (PLAQUENIL ) 200 MG tablet    Sig: Take 2 tablets (400 mg total) by mouth daily.    Dispense:  180 tablet    Refill:  0   amLODipine  (NORVASC ) 5 MG tablet    Sig: Take 1 tablet (5 mg total) by mouth daily.    Dispense:  90 tablet    Refill:  0   azaTHIOprine  (IMURAN ) 50 MG tablet    Sig: Take 1 tablet by mouth daily for 1 month then 2 tablets daily    Dispense:  60 tablet    Refill:  1     Follow-Up Instructions: Return in about 10 weeks (around 06/02/2024) for SLE on HCQ/AZA start f/u 2-23mos.   Lonni LELON Ester, MD  Note - This record has been created using  Animal nutritionist.  Chart creation errors have been sought, but may not always  have been  located. Such creation errors do not reflect on  the standard of medical care.

## 2024-03-24 ENCOUNTER — Ambulatory Visit: Attending: Internal Medicine | Admitting: Internal Medicine

## 2024-03-24 ENCOUNTER — Encounter: Payer: Self-pay | Admitting: Internal Medicine

## 2024-03-24 VITALS — BP 118/83 | HR 70 | Resp 14 | Ht 65.5 in | Wt 259.0 lb

## 2024-03-24 DIAGNOSIS — I7300 Raynaud's syndrome without gangrene: Secondary | ICD-10-CM | POA: Diagnosis not present

## 2024-03-24 DIAGNOSIS — Z79899 Other long term (current) drug therapy: Secondary | ICD-10-CM | POA: Diagnosis not present

## 2024-03-24 DIAGNOSIS — R5383 Other fatigue: Secondary | ICD-10-CM

## 2024-03-24 DIAGNOSIS — M25552 Pain in left hip: Secondary | ICD-10-CM

## 2024-03-24 DIAGNOSIS — M329 Systemic lupus erythematosus, unspecified: Secondary | ICD-10-CM

## 2024-03-24 MED ORDER — HYDROXYCHLOROQUINE SULFATE 200 MG PO TABS: 400.0000 mg | ORAL_TABLET | Freq: Every day | ORAL | 0 refills | Status: DC

## 2024-03-24 MED ORDER — AZATHIOPRINE 50 MG PO TABS: ORAL_TABLET | ORAL | 1 refills | Status: DC

## 2024-03-24 MED ORDER — AMLODIPINE BESYLATE 5 MG PO TABS: 5.0000 mg | ORAL_TABLET | Freq: Every day | ORAL | 0 refills | Status: DC

## 2024-03-25 LAB — CBC WITH DIFFERENTIAL/PLATELET
Absolute Lymphocytes: 1403 {cells}/uL (ref 850–3900)
Absolute Monocytes: 449 {cells}/uL (ref 200–950)
Basophils Absolute: 31 {cells}/uL (ref 0–200)
Basophils Relative: 0.6 %
Eosinophils Absolute: 148 {cells}/uL (ref 15–500)
Eosinophils Relative: 2.9 %
HCT: 39.2 % (ref 35.0–45.0)
Hemoglobin: 12.1 g/dL (ref 11.7–15.5)
MCH: 26.3 pg — ABNORMAL LOW (ref 27.0–33.0)
MCHC: 30.9 g/dL — ABNORMAL LOW (ref 32.0–36.0)
MCV: 85.2 fL (ref 80.0–100.0)
MPV: 9.7 fL (ref 7.5–12.5)
Monocytes Relative: 8.8 %
Neutro Abs: 3070 {cells}/uL (ref 1500–7800)
Neutrophils Relative %: 60.2 %
Platelets: 298 Thousand/uL (ref 140–400)
RBC: 4.6 Million/uL (ref 3.80–5.10)
RDW: 14.3 % (ref 11.0–15.0)
Total Lymphocyte: 27.5 %
WBC: 5.1 Thousand/uL (ref 3.8–10.8)

## 2024-03-25 LAB — COMPREHENSIVE METABOLIC PANEL WITH GFR
AG Ratio: 1.4 (calc) (ref 1.0–2.5)
ALT: 18 U/L (ref 6–29)
AST: 28 U/L (ref 10–30)
Albumin: 4 g/dL (ref 3.6–5.1)
Alkaline phosphatase (APISO): 64 U/L (ref 31–125)
BUN: 7 mg/dL (ref 7–25)
CO2: 27 mmol/L (ref 20–32)
Calcium: 9.8 mg/dL (ref 8.6–10.2)
Chloride: 109 mmol/L (ref 98–110)
Creat: 0.84 mg/dL (ref 0.50–0.99)
Globulin: 2.8 g/dL (ref 1.9–3.7)
Glucose, Bld: 88 mg/dL (ref 65–99)
Potassium: 3.9 mmol/L (ref 3.5–5.3)
Sodium: 144 mmol/L (ref 135–146)
Total Bilirubin: 0.3 mg/dL (ref 0.2–1.2)
Total Protein: 6.8 g/dL (ref 6.1–8.1)
eGFR: 88 mL/min/1.73m2 (ref >=60)

## 2024-03-25 LAB — ANTI-DNA ANTIBODY, DOUBLE-STRANDED: ds DNA Ab: 11 [IU]/mL — ABNORMAL HIGH

## 2024-03-25 LAB — CK: Total CK: 645 U/L — ABNORMAL HIGH (ref 20–239)

## 2024-03-25 LAB — SEDIMENTATION RATE: Sed Rate: 19 mm/h (ref 0–20)

## 2024-04-21 ENCOUNTER — Other Ambulatory Visit: Payer: Self-pay | Admitting: Internal Medicine

## 2024-04-21 DIAGNOSIS — M329 Systemic lupus erythematosus, unspecified: Secondary | ICD-10-CM

## 2024-04-21 LAB — TSH: TSH: 4.09 (ref 0.41–5.90)

## 2024-04-21 LAB — BASIC METABOLIC PANEL WITH GFR
BUN: 7 (ref 4–21)
Creatinine: 0.7 (ref 0.5–1.1)

## 2024-04-21 LAB — COMPREHENSIVE METABOLIC PANEL WITH GFR
Calcium: 11.2 — AB (ref 8.7–10.7)
eGFR: 109

## 2024-04-21 NOTE — Telephone Encounter (Addendum)
 Last Fill: 03/24/2024  Labs: 03/24/2024 CMP WNL CBC w/ Diff MCH 26.3 MCHC 30.9 ds DNA Ab 11 Total CK 645  Next Visit: 06/05/2024  Last Visit: 03/24/2024  DX: Systemic lupus erythematosus, unspecified SLE type, unspecified organ involvement status   Current Dose per office note 03/24/2024: Imuran  2 tablets by mouth daily   Okay to refill Imuran ?

## 2024-05-16 LAB — COMPREHENSIVE METABOLIC PANEL WITH GFR: Calcium: 0 — AB (ref 8.7–10.7)

## 2024-05-23 NOTE — Progress Notes (Signed)
 Office Visit Note  Patient: Monica Ashley             Date of Birth: May 01, 1981           MRN: [id number]             PCP: Kathrene Mardy HERO, PA-C Referring: Allwardt, Mardy HERO, PA-C Visit Date: 06/05/2024   Subjective:    Discussed the use of AI scribe software for clinical note transcription with the patient, who gave verbal consent to proceed.  History of Present Illness   Monica Ashley is a 43 y.o. female here for follow up with lupus on HCQ 400 mg daily azathioprine  100 mg BID and amlodipine  5 mg daily.   She experiences persistent fatigue, which has slightly improved since her last visit. She has started taking an iron supplement to help with her energy levels. Despite this, she continues to feel tired, especially by the end of the day, and reports poor sleep quality at night.  Joint pain persists, although stiffness has decreased and is not as prolonged in the mornings. She is currently taking Imuran , two pills daily, and has not noticed any side effects from the medication.  She has a history of thyroid  issues, which have been ongoing, but no recent changes in medication for this condition. A few months ago, blood tests revealed high calcium levels, leading to further testing that indicated elevated parathyroid hormone levels. She has been referred to endocrinology for further evaluation but has not yet had an appointment. She reports swelling and soreness in her neck, and relays that she was told by others that it was due to an enlarged gland.  She experiences muscle cramps in her arms and legs and takes a stool softener daily to manage constipation. 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  She describes a specific issue with her finger, which becomes painful and swollen, sometimes locking and causing significant discomfort. This has been occurring for more than a week. She notes that it feels like a blood  blister or cyst might be forming, similar to ulcers she has had on her fingers in the past. Her husband, who has experience with trigger finger, has suggested exercises to help manage the symptoms.      Previous HPI 03/24/2024 SHANDALE Ashley is a 43 y.o. female here for follow up with lupus on HCQ 400 mg daily     She experiences significant joint pain affecting her elbows, hips, hands, and feet, describing it as feeling like 'concrete in everything.' The pain severity led to a referral to orthopedics. She is scheduled to start physical therapy next week.   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 The fatigue is described as 'terrible' and has persisted over the last few months.   No recent viral illnesses or changes in sun exposure. There have been no significant changes in her routine or environment that could account for her symptoms. She reports swelling in her hands and some soreness in her lower back. She uses sunscreen consistently, specifically Neutrogena with high SPF.       Previous HPI 12/23/2023 Monica Ashley is a 43 year old female with lupus on HCQ 400 mg daily who presents with continued left hip pain and Raynaud's symptoms.   She has been experiencing hip pain for about  a week, described as a 'catch' in the hip when walking or getting up and down. This pain has caused limping, especially noticeable when walking in the parking lot at work or at [employer]. She has increased her walking to alleviate discomfort, as she spends much of her day sitting at a computer. There have been no recent falls or injuries. The pain is localized to the hip and does not radiate down the leg, differing from previous sciatic nerve issues. No imaging studies have been done specifically for the hip. No significant pain when pressing on the hip, but soreness is noted on the side of the hip. No significant  pain while sleeping, though rolling over can sometimes cause discomfort.   She reports worsening symptoms of Raynaud's phenomenon, with finger discoloration occurring four to five times a day, often triggered by cold environments. She has not been on any medication for Raynaud's recently, as she did not have a prescription for amlodipine , which she previously took at a dose of 5 mg without issues. Her Raynaud's symptoms have been particularly bothersome over the past year. She has a history of premature heartbeats, for which she underwent cardiac testing and was switched from amlodipine  to metoprolol. She notes that metoprolol may have worsened her Raynaud's symptoms.   Her lupus is currently active, as indicated by high double-stranded DNA markers and elevated sedimentation rate. She experiences facial redness, which she attributes to sun exposure, and uses Neutrogena SPF 100 for protection. She typically wears long sleeves even in hot weather to protect her skin.         Previous HPI 10/25/23 Monica Ashley is a 43 year old female referred from PCP office for rheumatology evaluation and management for history of lupus.   She has a history of lupus diagnosed in 2015, initially presenting with joint pain, rashes, Raynaud's phenomenon, and circulation problems. Her symptoms improved with medication, but she has not been on lupus-specific medication recently due to discontinuation of care with her previous rheumatologist. She dislikes frequent doctor visits, which impacts her medication adherence.   She experiences joint pain, particularly in her knees, making squatting and standing difficult. She has been off azathioprine  and hydroxychloroquine , which she previously took along with methotrexate. Azathioprine  helped her stomach issues related to nondiabetic gastroparesis. She has not taken steroids in the past month but did take them within the last six months for joint pain, describing them as a  'miracle drug' for her symptoms.   She describes a recent flare of Raynaud's phenomenon in November, resulting in a blood blister-like lesion on her finger that took a long time to heal. She was previously on amlodipine  for Raynaud's but was switched to metoprolol by her cardiologist for irregular heartbeats, which she notes does not help with Raynaud's symptoms.   She has a history of fatty liver disease and recent right-sided abdominal pain. An ultrasound conducted three weeks ago on 10/12/23 revealed fatty liver and an enlarged spleen. She has had elevated liver enzymes in the past but not to a degree that required medication changes.   She has a history of nondiabetic gastroparesis, which was severe in 2015, causing vomiting and inability to eat. Azathioprine  was noted to help with her stomach symptoms. She has not reported any recent kidney involvement or concerns, although she has an ectopic left kidney.   She experiences significant fatigue, describing it as feeling like 'quicksand in my blood,' which has persisted for about a month. She works in an  office doing payroll and billing, which involves prolonged sitting, contributing to her musculoskeletal discomfort.    Lupus Hx +ANA, +dsDNA, +RNP Raynaud's CK eelvation Malar rash Photosensitivity Oral ulcers Serositis Sicca symptoms   DMARD Hx Methotrexate 2015 - d/c 2/2 GI intolerance AZA ? Duration HCQ ? Duration   Labs reviewed 10/2018 dsDNA 166 C3/C4 wnl    Review of Systems  Constitutional:  Positive for fatigue.  HENT:  Negative for mouth sores and mouth dryness.        Nose sore  Eyes:  Positive for dryness.  Respiratory:  Positive for shortness of breath.   Cardiovascular:  Negative for chest pain and palpitations.  Gastrointestinal:  Positive for constipation and diarrhea. Negative for blood in stool.  Endocrine: Positive for increased urination.  Genitourinary:  Negative for involuntary urination.   Musculoskeletal:  Positive for joint pain, gait problem, joint pain, joint swelling, myalgias, muscle weakness, morning stiffness, muscle tenderness and myalgias.  Skin:  Positive for color change, rash, hair loss and sensitivity to sunlight.  Allergic/Immunologic: Positive for susceptible to infections.  Neurological:  Negative for dizziness and headaches.  Hematological:  Positive for swollen glands.  Psychiatric/Behavioral:  Positive for depressed mood and sleep disturbance. The patient is nervous/anxious.     PMFS History:  Patient Active Problem List   Diagnosis Date Noted   Trigger finger, right middle finger 06/05/2024   Raynaud's syndrome without gangrene 06/05/2024   Other fatigue 03/24/2024   Pain in left hip 03/24/2024   Splenomegaly 10/25/2023   High risk medication use 10/25/2023   Vitamin D  deficiency 10/25/2023   Bilateral shoulder pain 10/25/2023   Migraine with aura and without status migrainosus, not intractable 05/26/2020   Decreased libido 12/25/2016   Dyspareunia in female 12/25/2016   Abdominal cramps 12/25/2016   Irregular periods 12/25/2016   Fever 04/19/2014   Obesity 04/19/2014   Diarrhea 04/19/2014   Tachycardia 04/19/2014   Systemic lupus erythematosus (HCC)     Past Medical History:  Diagnosis Date   Anxiety    Bilateral hearing loss    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 ear    Depression    Elevated LFTs    Enlarged liver    Enlarged lymph node    Gastroparesis    Leukopenia    Lupus    Migraine    Obesity    Raynaud disease    Spleen enlarged    Systemic lupus erythematosus (HCC)     Family History  Problem Relation Age of Onset   Gout Mother    Hypertension Mother    Autoimmune disease Mother    Seizures Mother    Migraines Mother    Arrhythmia Mother    Gout Father    Diabetes Maternal Grandmother    Hypertension Maternal Grandmother    CAD Maternal Grandmother    Stroke Maternal Grandmother    Stroke Maternal Grandfather    Hypertension  Paternal Grandmother    Diabetes Paternal Grandmother    Hypertension Paternal Grandfather    Hyperlipidemia Paternal Grandfather    CAD Paternal Grandfather    Heart attack Paternal Grandfather    Heart defect Daughter        at birth   Deafness Son        left ear   Past Surgical History:  Procedure Laterality Date   Bladder stretched     as a child   CESAREAN SECTION  01/12/2003   CESAREAN SECTION  11/24/2004   CHOLECYSTECTOMY     TONSILLECTOMY  AND ADENOIDECTOMY  2009   Social History   Social History Narrative   Currently working as a Lawyer for [employer].    Lives at home with husband   Right handed   Caffeine: daily; tea, occasional soda     There is no immunization history on file for this patient.   Objective: Vital Signs: BP 129/77   Pulse 68   Temp 98.2 F (36.8 C)   Resp 16   Ht 5' 5.5 (1.664 m)   Wt 267 lb (121.1 kg)   BMI 43.76 kg/m    Physical Exam Constitutional:      Appearance: She is obese.  Eyes:     Conjunctiva/sclera: Conjunctivae normal.  Cardiovascular:     Rate and Rhythm: Normal rate and regular rhythm.  Pulmonary:     Effort: Pulmonary effort is normal.     Breath sounds: Normal breath sounds.  Lymphadenopathy:     Cervical: No cervical adenopathy.  Skin:    General: Skin is warm and dry.  Neurological:     Mental Status: She is alert.  Psychiatric:        Mood and Affect: Mood normal.      Musculoskeletal Exam:  Shoulders full ROM no tenderness or swelling Mild tenderness to pressure at paraspinal neck and upper back muscles, no radiation Elbows full ROM no tenderness or swelling Wrists full ROM no tenderness or swelling Fingers full ROM no tenderness, mild soft tissue swelling Knees full ROM no tenderness or swelling Ankles full ROM no tenderness or swelling      Investigation: No additional findings.  Imaging: No results found.  Recent Labs: Lab Results  Component Value Date    WBC 5.0 06/05/2024   HGB 11.8 06/05/2024   PLT 311 06/05/2024   NA 142 06/05/2024   K 3.6 06/05/2024   CL 107 06/05/2024   CO2 30 06/05/2024   GLUCOSE 86 06/05/2024   BUN 9 06/05/2024   CREATININE 0.73 06/05/2024   BILITOT 0.3 06/05/2024   ALKPHOS 49 09/17/2015   AST 30 06/05/2024   ALT 16 06/05/2024   PROT 6.9 06/05/2024   ALBUMIN 3.6 09/17/2015   CALCIUM 9.7 06/05/2024   GFRAA 96 05/23/2020    Speciality Comments: PLQ Eye Exam: Scheduled for September  Procedures:  No procedures performed Allergies: Codeine, Ciprofloxacin , Hydrocodone , and Tramadol    Assessment / Plan:     Visit Diagnoses: Systemic lupus erythematosus, unspecified SLE type, unspecified organ involvement status (HCC) - Plan: Sedimentation rate, CK, azaTHIOprine  (IMURAN ) 50 MG tablet, hydroxychloroquine  (PLAQUENIL ) 200 MG tablet Persistent fatigue and joint pain. Slight improvement in fatigue with Imuran . Joint stiffness decreased. No Imuran  side effects. Facial redness and muscle cramps persist. - Continue Imuran   100 mg daily - Continue HCQ 400 mgdaily - Provide hand exercises for trigger finger management.  High risk medication use - Hydroxychloroquine  400 mg daily. Needs PLQ Eye Exam - Plan: CBC with Differential/Platelet, Comprehensive metabolic panel with GFR Tolerattin oral medication well. No serious interval infections. - Checking CBC and CMP for medication monitoring on azathioprine  and hydroxychloroquine   Raynaud's syndrome without gangrene - Plan: amLODipine  (NORVASC ) 5 MG tablet - Continue amlodipine  5 mg daily  Trigger finger, right hand (suspected) Suspected trigger finger with pain, swelling, and locking in the right hand. No nodule detected. Discussed causes and advised on exercises. - Provide hand exercises to improve range of motion. - Monitor symptoms and avoid excessive manipulation.  Suspected primary hyperparathyroidism Elevated calcium and parathyroid hormone  levels. Symptoms  include neck swelling, muscle cramps, and constipation. Discussed long-term complications and potential need for surgery. - Await endocrinology appointment for further evaluation and management.        Orders: Orders Placed This Encounter  Procedures   Sedimentation rate   CK   CBC with Differential/Platelet   Comprehensive metabolic panel with GFR   Meds ordered this encounter  Medications   amLODipine  (NORVASC ) 5 MG tablet    Sig: Take 1 tablet (5 mg total) by mouth daily.    Dispense:  90 tablet    Refill:  0   azaTHIOprine  (IMURAN ) 50 MG tablet    Sig: TAKE TWO TABLETS BY MOUTH DAILY    Dispense:  180 tablet    Refill:  0   hydroxychloroquine  (PLAQUENIL ) 200 MG tablet    Sig: Take 2 tablets (400 mg total) by mouth daily.    Dispense:  180 tablet    Refill:  0     Follow-Up Instructions: Return in about 3 months (around 09/04/2024) for SLE on HCQ/AZA f/u 3mos.   Lonni LELON Ester, MD  Note - This record has been created using [employer].  Chart creation errors have been sought, but may not always  have been located. Such creation errors do not reflect on  the standard of medical care.

## 2024-06-05 ENCOUNTER — Encounter: Payer: Self-pay | Admitting: Internal Medicine

## 2024-06-05 ENCOUNTER — Ambulatory Visit: Attending: Internal Medicine | Admitting: Internal Medicine

## 2024-06-05 VITALS — BP 129/77 | HR 68 | Temp 98.2°F | Resp 16 | Ht 65.5 in | Wt 267.0 lb

## 2024-06-05 DIAGNOSIS — I7300 Raynaud's syndrome without gangrene: Secondary | ICD-10-CM | POA: Diagnosis not present

## 2024-06-05 DIAGNOSIS — Z79899 Other long term (current) drug therapy: Secondary | ICD-10-CM | POA: Diagnosis not present

## 2024-06-05 DIAGNOSIS — M65331 Trigger finger, right middle finger: Secondary | ICD-10-CM | POA: Diagnosis not present

## 2024-06-05 DIAGNOSIS — M329 Systemic lupus erythematosus, unspecified: Secondary | ICD-10-CM

## 2024-06-05 LAB — COMPREHENSIVE METABOLIC PANEL WITH GFR: Calcium: 9.7 (ref 8.7–10.7)

## 2024-06-05 MED ORDER — AMLODIPINE BESYLATE 5 MG PO TABS: 5.0000 mg | ORAL_TABLET | Freq: Every day | ORAL | 0 refills | Status: AC

## 2024-06-05 MED ORDER — AZATHIOPRINE 50 MG PO TABS: ORAL_TABLET | ORAL | 0 refills | Status: DC

## 2024-06-05 MED ORDER — HYDROXYCHLOROQUINE SULFATE 200 MG PO TABS: 400.0000 mg | ORAL_TABLET | Freq: Every day | ORAL | 0 refills | Status: AC

## 2024-06-06 ENCOUNTER — Other Ambulatory Visit: Payer: Self-pay | Admitting: Internal Medicine

## 2024-06-06 DIAGNOSIS — M329 Systemic lupus erythematosus, unspecified: Secondary | ICD-10-CM

## 2024-06-06 LAB — CBC WITH DIFFERENTIAL/PLATELET
Absolute Lymphocytes: 1390 {cells}/uL (ref 850–3900)
Absolute Monocytes: 465 {cells}/uL (ref 200–950)
Basophils Absolute: 40 {cells}/uL (ref 0–200)
Basophils Relative: 0.8 %
Eosinophils Absolute: 150 {cells}/uL (ref 15–500)
Eosinophils Relative: 3 %
HCT: 37.3 % (ref 35.0–45.0)
Hemoglobin: 11.8 g/dL (ref 11.7–15.5)
MCH: 27.4 pg (ref 27.0–33.0)
MCHC: 31.6 g/dL — ABNORMAL LOW (ref 32.0–36.0)
MCV: 86.5 fL (ref 80.0–100.0)
MPV: 10.2 fL (ref 7.5–12.5)
Monocytes Relative: 9.3 %
Neutro Abs: 2955 {cells}/uL (ref 1500–7800)
Neutrophils Relative %: 59.1 %
Platelets: 311 Thousand/uL (ref 140–400)
RBC: 4.31 Million/uL (ref 3.80–5.10)
RDW: 14.1 % (ref 11.0–15.0)
Total Lymphocyte: 27.8 %
WBC: 5 Thousand/uL (ref 3.8–10.8)

## 2024-06-06 LAB — COMPREHENSIVE METABOLIC PANEL WITH GFR
AG Ratio: 1.7 (calc) (ref 1.0–2.5)
ALT: 16 U/L (ref 6–29)
AST: 30 U/L (ref 10–30)
Albumin: 4.3 g/dL (ref 3.6–5.1)
Alkaline phosphatase (APISO): 60 U/L (ref 31–125)
BUN: 9 mg/dL (ref 7–25)
CO2: 30 mmol/L (ref 20–32)
Calcium: 9.7 mg/dL (ref 8.6–10.2)
Chloride: 107 mmol/L (ref 98–110)
Creat: 0.73 mg/dL (ref 0.50–0.99)
Globulin: 2.6 g/dL (ref 1.9–3.7)
Glucose, Bld: 86 mg/dL (ref 65–99)
Potassium: 3.6 mmol/L (ref 3.5–5.3)
Sodium: 142 mmol/L (ref 135–146)
Total Bilirubin: 0.3 mg/dL (ref 0.2–1.2)
Total Protein: 6.9 g/dL (ref 6.1–8.1)
eGFR: 104 mL/min/1.73m2 (ref >=60)

## 2024-06-06 LAB — SEDIMENTATION RATE: Sed Rate: 14 mm/h (ref 0–20)

## 2024-06-06 LAB — CK: Total CK: 561 U/L — ABNORMAL HIGH (ref 20–239)

## 2024-08-08 ENCOUNTER — Encounter (INDEPENDENT_AMBULATORY_CARE_PROVIDER_SITE_OTHER): Payer: Self-pay | Admitting: *Deleted

## 2024-08-22 NOTE — Progress Notes (Deleted)
 Office Visit Note  Patient: Monica Ashley             Date of Birth: [birth year]           MRN: [id number]             PCP: Kathrene Mardy HERO, PA-C Referring: Allwardt, Mardy HERO, PA-C Visit Date: 09/04/2024   Subjective:  No chief complaint on file.   History of Present Illness: Monica Ashley is a 43 y.o. female here for follow up with lupus on HCQ 400 mg daily azathioprine  100 mg BID and amlodipine  5 mg daily.    Previous HPI 06/05/2024 Monica Ashley is a 43 y.o. female here for follow up with lupus on HCQ 400 mg daily azathioprine  100 mg BID and amlodipine  5 mg daily.    She experiences persistent fatigue, which has slightly improved since her last visit. She has started taking an iron supplement to help with her energy levels. Despite this, she continues to feel tired, especially by the end of the day, and reports poor sleep quality at night.   Joint pain persists, although stiffness has decreased and is not as prolonged in the mornings. She is currently taking Imuran , two pills daily, and has not noticed any side effects from the medication.   She has a history of thyroid  issues, which have been ongoing, but no recent changes in medication for this condition. A few months ago, blood tests revealed high calcium levels, leading to further testing that indicated elevated parathyroid hormone levels. She has been referred to endocrinology for further evaluation but has not yet had an appointment. She reports swelling and soreness in her neck, and relays that she was told by others that it was due to an enlarged gland.   She experiences muscle cramps in her arms and legs and takes a stool softener daily to manage constipation. 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   She describes a specific issue with her finger, which becomes painful and swollen, sometimes locking and causing significant discomfort. This has been  occurring for more than a week. She notes that it feels like a blood blister or cyst might be forming, similar to ulcers she has had on her fingers in the past. Her husband, who has experience with trigger finger, has suggested exercises to help manage the symptoms.       Previous HPI 03/24/2024 Monica Ashley is a 43 y.o. female here for follow up with lupus on HCQ 400 mg daily     She experiences significant joint pain affecting her elbows, hips, hands, and feet, describing it as feeling like 'concrete in everything.' The pain severity led to a referral to orthopedics. She is scheduled to start physical therapy next week.   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 The fatigue is described as 'terrible' and has persisted over the last few months.   No recent viral illnesses or changes in sun exposure. There have been no significant changes in her routine or environment that could account for her symptoms. She reports swelling in her hands and some soreness in her lower back. She uses sunscreen consistently, specifically Neutrogena with high SPF.       Previous HPI 12/23/2023 Monica Ashley is a 43 year old female with lupus on HCQ 400  mg daily who presents with continued left hip pain and Raynaud's symptoms.   She has been experiencing hip pain for about a week, described as a 'catch' in the hip when walking or getting up and down. This pain has caused limping, especially noticeable when walking in the parking lot at work or at [employer]. She has increased her walking to alleviate discomfort, as she spends much of her day sitting at a computer. There have been no recent falls or injuries. The pain is localized to the hip and does not radiate down the leg, differing from previous sciatic nerve issues. No imaging studies have been done specifically for the hip. No significant pain when pressing on  the hip, but soreness is noted on the side of the hip. No significant pain while sleeping, though rolling over can sometimes cause discomfort.   She reports worsening symptoms of Raynaud's phenomenon, with finger discoloration occurring four to five times a day, often triggered by cold environments. She has not been on any medication for Raynaud's recently, as she did not have a prescription for amlodipine , which she previously took at a dose of 5 mg without issues. Her Raynaud's symptoms have been particularly bothersome over the past year. She has a history of premature heartbeats, for which she underwent cardiac testing and was switched from amlodipine  to metoprolol. She notes that metoprolol may have worsened her Raynaud's symptoms.   Her lupus is currently active, as indicated by high double-stranded DNA markers and elevated sedimentation rate. She experiences facial redness, which she attributes to sun exposure, and uses Neutrogena SPF 100 for protection. She typically wears long sleeves even in hot weather to protect her skin.         Previous HPI 10/25/23 Monica Ashley is a 43 year old female referred from PCP office for rheumatology evaluation and management for history of lupus.   She has a history of lupus diagnosed in 2015, initially presenting with joint pain, rashes, Raynaud's phenomenon, and circulation problems. Her symptoms improved with medication, but she has not been on lupus-specific medication recently due to discontinuation of care with her previous rheumatologist. She dislikes frequent doctor visits, which impacts her medication adherence.   She experiences joint pain, particularly in her knees, making squatting and standing difficult. She has been off azathioprine  and hydroxychloroquine , which she previously took along with methotrexate. Azathioprine  helped her stomach issues related to nondiabetic gastroparesis. She has not taken steroids in the past month but did take them  within the last six months for joint pain, describing them as a 'miracle drug' for her symptoms.   She describes a recent flare of Raynaud's phenomenon in November, resulting in a blood blister-like lesion on her finger that took a long time to heal. She was previously on amlodipine  for Raynaud's but was switched to metoprolol by her cardiologist for irregular heartbeats, which she notes does not help with Raynaud's symptoms.   She has a history of fatty liver disease and recent right-sided abdominal pain. An ultrasound conducted three weeks ago on 10/12/23 revealed fatty liver and an enlarged spleen. She has had elevated liver enzymes in the past but not to a degree that required medication changes.   She has a history of nondiabetic gastroparesis, which was severe in 2015, causing vomiting and inability to eat. Azathioprine  was noted to help with her stomach symptoms. She has not reported any recent kidney involvement or concerns, although she has an ectopic left kidney.   She experiences  significant fatigue, describing it as feeling like 'quicksand in my blood,' which has persisted for about a month. She works in an programmer, systems, which involves prolonged sitting, contributing to her musculoskeletal discomfort.    Lupus Hx +ANA, +dsDNA, +RNP Raynaud's CK eelvation Malar rash Photosensitivity Oral ulcers Serositis Sicca symptoms   DMARD Hx Methotrexate 2015 - d/c 2/2 GI intolerance AZA ? Duration HCQ ? Duration   Labs reviewed 10/2018 dsDNA 166 C3/C4 wnl   No Rheumatology ROS completed.   PMFS History:  Patient Active Problem List   Diagnosis Date Noted   Trigger finger, right middle finger 06/05/2024   Raynaud's syndrome without gangrene 06/05/2024   Other fatigue 03/24/2024   Pain in left hip 03/24/2024   Splenomegaly 10/25/2023   High risk medication use 10/25/2023   Vitamin D  deficiency 10/25/2023   Bilateral shoulder pain 10/25/2023   Migraine with  aura and without status migrainosus, not intractable 05/26/2020   Decreased libido 12/25/2016   Dyspareunia in female 12/25/2016   Abdominal cramps 12/25/2016   Irregular periods 12/25/2016   Fever 04/19/2014   Obesity 04/19/2014   Diarrhea 04/19/2014   Tachycardia 04/19/2014   Systemic lupus erythematosus (HCC)     Past Medical History:  Diagnosis Date   Anxiety    Bilateral hearing loss    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 ear    Depression    Elevated LFTs    Enlarged liver    Enlarged lymph node    Gastroparesis    Leukopenia    Lupus    Migraine    Obesity    Raynaud disease    Spleen enlarged    Systemic lupus erythematosus (HCC)     Family History  Problem Relation Age of Onset   Gout Mother    Hypertension Mother    Autoimmune disease Mother    Seizures Mother    Migraines Mother    Arrhythmia Mother    Gout Father    Diabetes Maternal Grandmother    Hypertension Maternal Grandmother    CAD Maternal Grandmother    Stroke Maternal Grandmother    Stroke Maternal Grandfather    Hypertension Paternal Grandmother    Diabetes Paternal Grandmother    Hypertension Paternal Grandfather    Hyperlipidemia Paternal Grandfather    CAD Paternal Grandfather    Heart attack Paternal Grandfather    Heart defect Daughter        at birth   Deafness Son        left ear   Past Surgical History:  Procedure Laterality Date   Bladder stretched     as a child   CESAREAN SECTION  01/12/2003   CESAREAN SECTION  11/24/2004   CHOLECYSTECTOMY     TONSILLECTOMY AND ADENOIDECTOMY  2009   Social History   Social History Narrative   Currently working as a lawyer for [employer].    Lives at home with husband   Right handed   Caffeine: daily; tea, occasional soda     There is no immunization history on file for this patient.   Objective: Vital Signs: There were no vitals taken for this visit.   Physical Exam   Musculoskeletal Exam: ***  CDAI Exam: CDAI  Score: -- Patient Global: --; Provider Global: -- Swollen: --; Tender: -- Joint Exam 09/04/2024   No joint exam has been documented for this visit   There is currently no information documented on the homunculus. Go to the Rheumatology activity and  complete the homunculus joint exam.  Investigation: No additional findings.  Imaging: No results found.  Recent Labs: Lab Results  Component Value Date   WBC 5.0 06/05/2024   HGB 11.8 06/05/2024   PLT 311 06/05/2024   NA 142 06/05/2024   K 3.6 06/05/2024   CL 107 06/05/2024   CO2 30 06/05/2024   GLUCOSE 86 06/05/2024   BUN 9 06/05/2024   CREATININE 0.73 06/05/2024   BILITOT 0.3 06/05/2024   ALKPHOS 49 09/17/2015   AST 30 06/05/2024   ALT 16 06/05/2024   PROT 6.9 06/05/2024   ALBUMIN 3.6 09/17/2015   CALCIUM 9.7 06/05/2024   GFRAA 96 05/23/2020    Speciality Comments: PLQ Eye Exam: Scheduled for September  Procedures:  No procedures performed Allergies: Codeine, Ciprofloxacin , Hydrocodone , and Tramadol    Assessment / Plan:     Visit Diagnoses: No diagnosis found.  ***  Orders: No orders of the defined types were placed in this encounter.  No orders of the defined types were placed in this encounter.    Follow-Up Instructions: No follow-ups on file.   Leola Fiore M Geoff Dacanay, CMA  Note - This record has been created using [employer].  Chart creation errors have been sought, but may not always  have been located. Such creation errors do not reflect on  the standard of medical care.

## 2024-08-30 ENCOUNTER — Encounter (INDEPENDENT_AMBULATORY_CARE_PROVIDER_SITE_OTHER): Payer: Self-pay | Admitting: Gastroenterology

## 2024-08-30 ENCOUNTER — Ambulatory Visit (INDEPENDENT_AMBULATORY_CARE_PROVIDER_SITE_OTHER): Admitting: Gastroenterology

## 2024-08-30 VITALS — BP 127/81 | HR 64 | Temp 97.7°F | Ht 65.5 in | Wt 265.3 lb

## 2024-08-30 DIAGNOSIS — Z6841 Body Mass Index (BMI) 40.0 and over, adult: Secondary | ICD-10-CM | POA: Diagnosis not present

## 2024-08-30 DIAGNOSIS — R1013 Epigastric pain: Secondary | ICD-10-CM | POA: Insufficient documentation

## 2024-08-30 DIAGNOSIS — K920 Hematemesis: Secondary | ICD-10-CM

## 2024-08-30 DIAGNOSIS — K3184 Gastroparesis: Secondary | ICD-10-CM | POA: Diagnosis not present

## 2024-08-30 DIAGNOSIS — R1310 Dysphagia, unspecified: Secondary | ICD-10-CM

## 2024-08-30 DIAGNOSIS — R10816 Epigastric abdominal tenderness: Secondary | ICD-10-CM | POA: Diagnosis not present

## 2024-08-30 NOTE — Patient Instructions (Signed)
 It was very nice to meet you today, as dicussed with will plan for the following :  1) upper endoscopy  2) gastric emptying study  3) ultrasound and labs  4) gastroparesis diet  Diet Recommendations for Gastroparesis:  Small, Frequent Meals: Eat smaller meals more often to reduce the burden on the stomach. Low-Fat Foods: Fat slows gastric emptying; opt for low-fat options. Low-Fiber Foods: Fiber can be difficult to digest; choose low-fiber foods to ease symptoms. Soft or Liquid Foods: Easier to digest, such as soups, smoothies, and pureed foods. Chew Food Thoroughly: Helps with digestion and reduces strain on the stomach. Hydration: Drink plenty of water throughout the day.  Foods to Consider: Lean proteins: Chicken, fish, tofu. Refined grains: White bread, white rice, pasta. Cooked vegetables: Without skins or seeds. Low-fat dairy: Milk, yogurt.  Foods to Avoid: High-fat foods: Fried foods, creamy sauces. High-fiber foods: Raw vegetables, whole grains. Carbonated beverages: Can cause bloating.  Additional Tips: Sit up after eating: To help with digestion. Use a blender: To make food easier to consume.

## 2024-08-30 NOTE — Progress Notes (Signed)
 Jesse Hirst Faizan Sadey Yandell , M.D. Gastroenterology & Hepatology [facility] [address] [place], [place] [address] Primary Care Physician: Allwardt, Mardy HERO, PA-C [address] [place] [address]  Chief Complaint: Gastroparesis, hematemesis, abdominal pain, nausea and vomiting  History of Present Illness: Monica Ashley is a 43 y.o. female with SLE on azathioprine  /hydroxychloroquine , anxiety, migraine who presents for evaluation of Gastroparesis, hematemesis, abdominal pain, nausea and vomiting  Patient reports 10 years ago she was given diagnosis of gastroparesis.  Has not been maintained on any medications for that.  Patient only eats bland diet with bread and rice.  Has postprandial nausea and vomiting.  2 days ago she has noticed fresh blood in the vomitus.  This happened to her 10 years ago where she underwent upper endoscopy and gastric emptying study at [facility].  Patient takes Protonix  daily without much relief.  Patient reports epigastric and right upper quadrant pain mostly postprandial . Last EGD:10 years ago  Last Colonoscopy:none  FHx: neg for any gastrointestinal/liver disease, no malignancies Social: neg smoking, alcohol or illicit drug use Surgical: Cholecystectomy  Labs from 05/2024 normal liver enzymes hemoglobin 11.9  Last hemoglobin A1c reported 12/2022 5.7 Past Medical History: Past Medical History:  Diagnosis Date   Anxiety    Bilateral hearing loss    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 ear    Depression    Elevated LFTs    Enlarged liver    Enlarged lymph node    Gastroparesis    Leukopenia    Lupus    Migraine    Obesity    Raynaud disease    Spleen enlarged    Systemic lupus erythematosus (HCC)     Past Surgical History: Past Surgical History:  Procedure Laterality Date   Bladder stretched     as a child   CESAREAN SECTION  01/12/2003   CESAREAN SECTION  11/24/2004   CHOLECYSTECTOMY     TONSILLECTOMY AND  ADENOIDECTOMY  2009    Family History: Family History  Problem Relation Age of Onset   Gout Mother    Hypertension Mother    Autoimmune disease Mother    Seizures Mother    Migraines Mother    Arrhythmia Mother    Gout Father    Diabetes Maternal Grandmother    Hypertension Maternal Grandmother    CAD Maternal Grandmother    Stroke Maternal Grandmother    Stroke Maternal Grandfather    Hypertension Paternal Grandmother    Diabetes Paternal Grandmother    Hypertension Paternal Grandfather    Hyperlipidemia Paternal Grandfather    CAD Paternal Grandfather    Heart attack Paternal Grandfather    Heart defect Daughter        at birth   Deafness Son        left ear    Social History:Tobacco Use History[1] Social History   Substance and Sexual Activity  Alcohol Use No   Social History   Substance and Sexual Activity  Drug Use No    Allergies: Allergies[2]  Medications: Current Outpatient Medications  Medication Sig Dispense Refill   albuterol  (PROVENTIL  HFA;VENTOLIN  HFA) 108 (90 BASE) MCG/ACT inhaler Inhale 1-2 puffs into the lungs every 6 (six) hours as needed for wheezing or shortness of breath. 1 Inhaler 0   amLODipine  (NORVASC ) 5 MG tablet Take 1 tablet (5 mg total) by mouth daily. 90 tablet 0   aspirin  EC 81 MG tablet Take 1 tablet by mouth daily.     azaTHIOprine  (  IMURAN ) 50 MG tablet TAKE TWO TABLETS BY MOUTH DAILY 180 tablet 0   Cholecalciferol  (VITAMIN D -1000 MAX ST) 25 MCG (1000 UT) tablet Take by mouth.     Cyanocobalamin  (VITAMIN B 12 PO) Take by mouth.     diphenhydrAMINE  (BENADRYL  ALLERGY) 25 MG tablet 1 tablet as needed Orally every 6 hrs     Docusate Sodium  (DSS) 100 MG CAPS Take 1 capsule by mouth daily.     Ferrous Sulfate (IRON PO) Take by mouth daily.     gabapentin (NEURONTIN) 300 MG capsule Take 600 mg by mouth daily.      hydroxychloroquine  (PLAQUENIL ) 200 MG tablet Take 2 tablets (400 mg total) by mouth daily. 180 tablet 0   lidocaine  (XYLOCAINE) 2 % solution 15 ml as needed swallow every 6 hrs as needed; Duration: 7 days     meclizine (ANTIVERT) 25 MG tablet Take 25 mg by mouth 3 (three) times daily as needed for dizziness.     metFORMIN (GLUCOPHAGE) 500 MG tablet Take 500 mg by mouth 2 (two) times daily.     metoprolol succinate (TOPROL-XL) 25 MG 24 hr tablet Take 25 mg by mouth daily.     mirtazapine (REMERON) 30 MG tablet Take 30 mg by mouth at bedtime.     Multiple Vitamin (MULTIVITAMIN PO) Take by mouth.     ondansetron  (ZOFRAN -ODT) 4 MG disintegrating tablet dissolve 1 tablet in mouth every 8 hours as needed for nausea     pantoprazole  (PROTONIX ) 40 MG tablet Take 40 mg by mouth 2 (two) times daily.     promethazine  (PHENERGAN ) 25 MG tablet Take 25 mg by mouth every 6 (six) hours as needed for nausea.     rizatriptan (MAXALT-MLT) 5 MG disintegrating tablet Take 5 mg by mouth as needed. May repeat in 2 hours if needed     rosuvastatin (CRESTOR) 20 MG tablet Take 1 tablet by mouth daily.     topiramate (TOPAMAX) 25 MG tablet Take 25 mg by mouth daily.     venlafaxine XR (EFFEXOR-XR) 37.5 MG 24 hr capsule Take 37.5 mg by mouth daily.     No current facility-administered medications for this visit.    Review of Systems: GENERAL: negative for malaise, night sweats HEENT: No changes in hearing or vision, no nose bleeds or other nasal problems. NECK: Negative for lumps, goiter, pain and significant neck swelling RESPIRATORY: Negative for cough, wheezing CARDIOVASCULAR: Negative for chest pain, leg swelling, palpitations, orthopnea GI: SEE HPI MUSCULOSKELETAL: Negative for joint pain or swelling, back pain, and muscle pain. SKIN: Negative for lesions, rash HEMATOLOGY Negative for prolonged bleeding, bruising easily, and swollen nodes. ENDOCRINE: Negative for cold or heat intolerance, polyuria, polydipsia and goiter. NEURO: negative for tremor, gait imbalance, syncope and seizures. The remainder of the review of systems  is noncontributory.   Physical Exam: BP 127/81   Pulse 64   Temp 97.7 F (36.5 C)   Ht 5' 5.5 (1.664 m)   Wt 265 lb 4.8 oz (120.3 kg)   BMI 43.48 kg/m  GENERAL: The patient is AO x3, in no acute distress. HEENT: Head is normocephalic and atraumatic. EOMI are intact. Mouth is well hydrated and without lesions. NECK: Supple. No masses LUNGS: Clear to auscultation. No presence of rhonchi/wheezing/rales. Adequate chest expansion HEART: RRR, normal s1 and s2. ABDOMEN: Soft, epigastric tenderness, no guarding, no peritoneal signs, and nondistended. BS +. No masses.  Imaging/Labs: as above     Latest Ref Rng & Units 06/05/2024  4:07 PM 03/24/2024    8:37 AM 12/23/2023    9:07 AM  CBC  WBC 3.8 - 10.8 Thousand/uL 5.0  5.1  7.5   Hemoglobin 11.7 - 15.5 g/dL 88.1  87.8  87.7   Hematocrit 35.0 - 45.0 % 37.3  39.2  37.5   Platelets 140 - 400 Thousand/uL 311  298  289    No results found for: IRON, TIBC, FERRITIN  I personally reviewed and interpreted the available labs, imaging and endoscopic files.  Impression and Plan: Monica Ashley is a 43 y.o. female with SLE on azathioprine  /hydroxychloroquine , anxiety, migraine who presents for evaluation of Gastroparesis, hematemesis, abdominal pain, nausea and vomiting  # Hematemesis #?  Idiopathic gastroparesis # Abdominal pain #Dysphagia   Patient reports having gastric emptying test done 10 years ago.  Not on any maintenance therapy.  Will obtain 4-hour gastric emptying study to evaluate severity of the gastroparesis.  After that we will decide on medical management  For now we will give extensive list for gastroparesis diet with low back and low residue diet  Patient has epigastric tenderness on exam today with report of hematemesis 2 days ago. This could be peptic ulcer disease Mallory-Weiss tear or esophagitis due to nausea and vomiting.  Patient has occasional solid food dysphagia with choking spells  Recommend upper  endoscopy+/-dilation/biopsies Complete abdominal ultrasound Will send lab work with CRP, alpha gal and celiac Continue Protonix   I thoroughly discussed with the patient the procedure, including the risks involved. Patient understands what the procedure involves including the benefits and any risks. Patient understands alternatives to the proposed procedure. Risks including (but not limited to) bleeding, tearing of the lining (perforation), rupture of adjacent organs, problems with heart and lung function, infection, and medication reactions. A small percentage of complications may require surgery, hospitalization, repeat endoscopic procedure, and/or transfusion.  Patient understood and agreed.   #BMI:43      - walking at a brisk pace/biking at moderate intesity 2.5-5 hours per week     - use pedometer/step counter to track activity     - goal to lose 5-10% of initial body weight     - avoid suagry drinks and juices, use zero calorie beverages     - increase water intake     - eat a low carb diet with plenty of veggies and fruit     - Get sufficient sleep 7-8 hrs nightly     - maitain active lifestyle     - avoid alcohol     - recommend 2-3 cups Coffee daily     - Counsel on lowering cholesterol by having a diet rich in vegetables,          protein (avoid red meats) and good fats(fish, salmon).     All questions were answered.      Ladarrious Kirksey Faizan Nekia Maxham, MD Gastroenterology and Hepatology [facility]   This chart has been completed using [facility] Dictation software, and while attempts have been made to ensure accuracy , certain words and phrases may not be transcribed as intended      [1]  Social History Tobacco Use  Smoking Status Never   Passive exposure: Never  Smokeless Tobacco Never  [2]  Allergies Allergen Reactions   Codeine Hives, Itching and Rash   Ciprofloxacin  Itching    Has itching all over.   Hydrocodone  Itching   Tramadol   Itching    Takes Benadryl 

## 2024-08-30 NOTE — H&P (View-Only) (Signed)
 Jesse Hirst Faizan Sadey Yandell , M.D. Gastroenterology & Hepatology [facility] [address] [place], [place] [address] Primary Care Physician: Allwardt, Mardy HERO, PA-C [address] [place] [address]  Chief Complaint: Gastroparesis, hematemesis, abdominal pain, nausea and vomiting  History of Present Illness: Monica Ashley is a 43 y.o. female with SLE on azathioprine  /hydroxychloroquine , anxiety, migraine who presents for evaluation of Gastroparesis, hematemesis, abdominal pain, nausea and vomiting  Patient reports 10 years ago she was given diagnosis of gastroparesis.  Has not been maintained on any medications for that.  Patient only eats bland diet with bread and rice.  Has postprandial nausea and vomiting.  2 days ago she has noticed fresh blood in the vomitus.  This happened to her 10 years ago where she underwent upper endoscopy and gastric emptying study at [facility].  Patient takes Protonix  daily without much relief.  Patient reports epigastric and right upper quadrant pain mostly postprandial . Last EGD:10 years ago  Last Colonoscopy:none  FHx: neg for any gastrointestinal/liver disease, no malignancies Social: neg smoking, alcohol or illicit drug use Surgical: Cholecystectomy  Labs from 05/2024 normal liver enzymes hemoglobin 11.9  Last hemoglobin A1c reported 12/2022 5.7 Past Medical History: Past Medical History:  Diagnosis Date   Anxiety    Bilateral hearing loss    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 ear    Depression    Elevated LFTs    Enlarged liver    Enlarged lymph node    Gastroparesis    Leukopenia    Lupus    Migraine    Obesity    Raynaud disease    Spleen enlarged    Systemic lupus erythematosus (HCC)     Past Surgical History: Past Surgical History:  Procedure Laterality Date   Bladder stretched     as a child   CESAREAN SECTION  01/12/2003   CESAREAN SECTION  11/24/2004   CHOLECYSTECTOMY     TONSILLECTOMY AND  ADENOIDECTOMY  2009    Family History: Family History  Problem Relation Age of Onset   Gout Mother    Hypertension Mother    Autoimmune disease Mother    Seizures Mother    Migraines Mother    Arrhythmia Mother    Gout Father    Diabetes Maternal Grandmother    Hypertension Maternal Grandmother    CAD Maternal Grandmother    Stroke Maternal Grandmother    Stroke Maternal Grandfather    Hypertension Paternal Grandmother    Diabetes Paternal Grandmother    Hypertension Paternal Grandfather    Hyperlipidemia Paternal Grandfather    CAD Paternal Grandfather    Heart attack Paternal Grandfather    Heart defect Daughter        at birth   Deafness Son        left ear    Social History:Tobacco Use History[1] Social History   Substance and Sexual Activity  Alcohol Use No   Social History   Substance and Sexual Activity  Drug Use No    Allergies: Allergies[2]  Medications: Current Outpatient Medications  Medication Sig Dispense Refill   albuterol  (PROVENTIL  HFA;VENTOLIN  HFA) 108 (90 BASE) MCG/ACT inhaler Inhale 1-2 puffs into the lungs every 6 (six) hours as needed for wheezing or shortness of breath. 1 Inhaler 0   amLODipine  (NORVASC ) 5 MG tablet Take 1 tablet (5 mg total) by mouth daily. 90 tablet 0   aspirin  EC 81 MG tablet Take 1 tablet by mouth daily.     azaTHIOprine  (  IMURAN ) 50 MG tablet TAKE TWO TABLETS BY MOUTH DAILY 180 tablet 0   Cholecalciferol  (VITAMIN D -1000 MAX ST) 25 MCG (1000 UT) tablet Take by mouth.     Cyanocobalamin  (VITAMIN B 12 PO) Take by mouth.     diphenhydrAMINE  (BENADRYL  ALLERGY) 25 MG tablet 1 tablet as needed Orally every 6 hrs     Docusate Sodium  (DSS) 100 MG CAPS Take 1 capsule by mouth daily.     Ferrous Sulfate (IRON PO) Take by mouth daily.     gabapentin (NEURONTIN) 300 MG capsule Take 600 mg by mouth daily.      hydroxychloroquine  (PLAQUENIL ) 200 MG tablet Take 2 tablets (400 mg total) by mouth daily. 180 tablet 0   lidocaine  (XYLOCAINE) 2 % solution 15 ml as needed swallow every 6 hrs as needed; Duration: 7 days     meclizine (ANTIVERT) 25 MG tablet Take 25 mg by mouth 3 (three) times daily as needed for dizziness.     metFORMIN (GLUCOPHAGE) 500 MG tablet Take 500 mg by mouth 2 (two) times daily.     metoprolol succinate (TOPROL-XL) 25 MG 24 hr tablet Take 25 mg by mouth daily.     mirtazapine (REMERON) 30 MG tablet Take 30 mg by mouth at bedtime.     Multiple Vitamin (MULTIVITAMIN PO) Take by mouth.     ondansetron  (ZOFRAN -ODT) 4 MG disintegrating tablet dissolve 1 tablet in mouth every 8 hours as needed for nausea     pantoprazole  (PROTONIX ) 40 MG tablet Take 40 mg by mouth 2 (two) times daily.     promethazine  (PHENERGAN ) 25 MG tablet Take 25 mg by mouth every 6 (six) hours as needed for nausea.     rizatriptan (MAXALT-MLT) 5 MG disintegrating tablet Take 5 mg by mouth as needed. May repeat in 2 hours if needed     rosuvastatin (CRESTOR) 20 MG tablet Take 1 tablet by mouth daily.     topiramate (TOPAMAX) 25 MG tablet Take 25 mg by mouth daily.     venlafaxine XR (EFFEXOR-XR) 37.5 MG 24 hr capsule Take 37.5 mg by mouth daily.     No current facility-administered medications for this visit.    Review of Systems: GENERAL: negative for malaise, night sweats HEENT: No changes in hearing or vision, no nose bleeds or other nasal problems. NECK: Negative for lumps, goiter, pain and significant neck swelling RESPIRATORY: Negative for cough, wheezing CARDIOVASCULAR: Negative for chest pain, leg swelling, palpitations, orthopnea GI: SEE HPI MUSCULOSKELETAL: Negative for joint pain or swelling, back pain, and muscle pain. SKIN: Negative for lesions, rash HEMATOLOGY Negative for prolonged bleeding, bruising easily, and swollen nodes. ENDOCRINE: Negative for cold or heat intolerance, polyuria, polydipsia and goiter. NEURO: negative for tremor, gait imbalance, syncope and seizures. The remainder of the review of systems  is noncontributory.   Physical Exam: BP 127/81   Pulse 64   Temp 97.7 F (36.5 C)   Ht 5' 5.5 (1.664 m)   Wt 265 lb 4.8 oz (120.3 kg)   BMI 43.48 kg/m  GENERAL: The patient is AO x3, in no acute distress. HEENT: Head is normocephalic and atraumatic. EOMI are intact. Mouth is well hydrated and without lesions. NECK: Supple. No masses LUNGS: Clear to auscultation. No presence of rhonchi/wheezing/rales. Adequate chest expansion HEART: RRR, normal s1 and s2. ABDOMEN: Soft, epigastric tenderness, no guarding, no peritoneal signs, and nondistended. BS +. No masses.  Imaging/Labs: as above     Latest Ref Rng & Units 06/05/2024  4:07 PM 03/24/2024    8:37 AM 12/23/2023    9:07 AM  CBC  WBC 3.8 - 10.8 Thousand/uL 5.0  5.1  7.5   Hemoglobin 11.7 - 15.5 g/dL 88.1  87.8  87.7   Hematocrit 35.0 - 45.0 % 37.3  39.2  37.5   Platelets 140 - 400 Thousand/uL 311  298  289    No results found for: IRON, TIBC, FERRITIN  I personally reviewed and interpreted the available labs, imaging and endoscopic files.  Impression and Plan: Monica Ashley is a 43 y.o. female with SLE on azathioprine  /hydroxychloroquine , anxiety, migraine who presents for evaluation of Gastroparesis, hematemesis, abdominal pain, nausea and vomiting  # Hematemesis #?  Idiopathic gastroparesis # Abdominal pain #Dysphagia   Patient reports having gastric emptying test done 10 years ago.  Not on any maintenance therapy.  Will obtain 4-hour gastric emptying study to evaluate severity of the gastroparesis.  After that we will decide on medical management  For now we will give extensive list for gastroparesis diet with low back and low residue diet  Patient has epigastric tenderness on exam today with report of hematemesis 2 days ago. This could be peptic ulcer disease Mallory-Weiss tear or esophagitis due to nausea and vomiting.  Patient has occasional solid food dysphagia with choking spells  Recommend upper  endoscopy+/-dilation/biopsies Complete abdominal ultrasound Will send lab work with CRP, alpha gal and celiac Continue Protonix   I thoroughly discussed with the patient the procedure, including the risks involved. Patient understands what the procedure involves including the benefits and any risks. Patient understands alternatives to the proposed procedure. Risks including (but not limited to) bleeding, tearing of the lining (perforation), rupture of adjacent organs, problems with heart and lung function, infection, and medication reactions. A small percentage of complications may require surgery, hospitalization, repeat endoscopic procedure, and/or transfusion.  Patient understood and agreed.   #BMI:43      - walking at a brisk pace/biking at moderate intesity 2.5-5 hours per week     - use pedometer/step counter to track activity     - goal to lose 5-10% of initial body weight     - avoid suagry drinks and juices, use zero calorie beverages     - increase water intake     - eat a low carb diet with plenty of veggies and fruit     - Get sufficient sleep 7-8 hrs nightly     - maitain active lifestyle     - avoid alcohol     - recommend 2-3 cups Coffee daily     - Counsel on lowering cholesterol by having a diet rich in vegetables,          protein (avoid red meats) and good fats(fish, salmon).     All questions were answered.      Ladarrious Kirksey Faizan Nekia Maxham, MD Gastroenterology and Hepatology [facility]   This chart has been completed using [facility] Dictation software, and while attempts have been made to ensure accuracy , certain words and phrases may not be transcribed as intended      [1]  Social History Tobacco Use  Smoking Status Never   Passive exposure: Never  Smokeless Tobacco Never  [2]  Allergies Allergen Reactions   Codeine Hives, Itching and Rash   Ciprofloxacin  Itching    Has itching all over.   Hydrocodone  Itching   Tramadol   Itching    Takes Benadryl 

## 2024-08-30 NOTE — Progress Notes (Signed)
 Note faxed to PCP.

## 2024-08-31 ENCOUNTER — Telehealth (INDEPENDENT_AMBULATORY_CARE_PROVIDER_SITE_OTHER): Payer: Self-pay

## 2024-08-31 NOTE — Telephone Encounter (Signed)
 Spoke with patient ~ scheduled her for a gastric emptying for 09/12/24 at 8am, ultrasound for 09/19/24 at 9:30am, and an EGD for 09/19/24 at 12:30pm. Patient aware and verbalized understanding. Instructions and appointment letter sent in the mail and through [employer].

## 2024-08-31 NOTE — Telephone Encounter (Signed)
 PA for Gastric Emptying on [employer]:  Order ID: [id number]       Authorized  Approval Valid Through: 08/31/2024 - 09/29/2024

## 2024-08-31 NOTE — Telephone Encounter (Signed)
 PA for EGD on [employer]: Order ID: [id number]       Authorized  Approval Valid Through: 08/31/2024 - 11/28/2024

## 2024-09-04 ENCOUNTER — Ambulatory Visit (INDEPENDENT_AMBULATORY_CARE_PROVIDER_SITE_OTHER): Admitting: Internal Medicine

## 2024-09-04 DIAGNOSIS — M65331 Trigger finger, right middle finger: Secondary | ICD-10-CM

## 2024-09-04 DIAGNOSIS — I7300 Raynaud's syndrome without gangrene: Secondary | ICD-10-CM

## 2024-09-04 DIAGNOSIS — M329 Systemic lupus erythematosus, unspecified: Secondary | ICD-10-CM

## 2024-09-04 DIAGNOSIS — Z79899 Other long term (current) drug therapy: Secondary | ICD-10-CM

## 2024-09-11 ENCOUNTER — Other Ambulatory Visit: Payer: Self-pay | Admitting: Internal Medicine

## 2024-09-11 DIAGNOSIS — M329 Systemic lupus erythematosus, unspecified: Secondary | ICD-10-CM

## 2024-09-11 NOTE — Telephone Encounter (Signed)
 Last Fill: 06/05/2024  Labs: 06/05/2024 MCHC 31.6 Rest of CBC and CMP WNL  Next Visit: 11/06/2024  Last Visit: 06/05/2024  DX: Systemic lupus erythematosus, unspecified SLE type, unspecified organ involvement status   Current Dose per office note 06/05/2024: Imuran   100 mg daily   Okay to refill Imuran ?  Contacted patient to advise that labs are due to be updated and provided lab hours.

## 2024-09-12 ENCOUNTER — Ambulatory Visit (HOSPITAL_COMMUNITY)
Admission: RE | Admit: 2024-09-12 | Discharge: 2024-09-12 | Disposition: A | Discharge status: Home / Self Care | Source: Ambulatory Visit | Attending: Gastroenterology | Admitting: Gastroenterology

## 2024-09-12 DIAGNOSIS — K3184 Gastroparesis: Secondary | ICD-10-CM | POA: Insufficient documentation

## 2024-09-12 DIAGNOSIS — R1013 Epigastric pain: Secondary | ICD-10-CM | POA: Insufficient documentation

## 2024-09-12 MED ORDER — TECHNETIUM TC 99M SULFUR COLLOID
2.0000 | Freq: Once | INTRAVENOUS | Status: AC | PRN
  Administered 2024-09-12: 2 via ORAL

## 2024-09-12 NOTE — Telephone Encounter (Signed)
 PA for EGD on [employer] secondary ins: Order ID: [id number]       Authorized  Approval Valid Through: 09/12/2024 - 12/10/2024

## 2024-09-12 NOTE — Telephone Encounter (Signed)
 PA gastric emptying [facility]EL[facility]: Order ID: [id number]       Authorized  Approval Valid Through: 09/12/2024 - 10/11/2024

## 2024-09-15 ENCOUNTER — Ambulatory Visit (INDEPENDENT_AMBULATORY_CARE_PROVIDER_SITE_OTHER): Payer: Self-pay | Admitting: Gastroenterology

## 2024-09-15 DIAGNOSIS — K3184 Gastroparesis: Secondary | ICD-10-CM

## 2024-09-15 MED ORDER — METOCLOPRAMIDE HCL 10 MG PO TABS: 10.0000 mg | ORAL_TABLET | Freq: Three times a day (TID) | ORAL | 0 refills | Status: AC

## 2024-09-15 NOTE — Progress Notes (Signed)
 Gastric emptying tsudy   21% emptied at 1 hr ( normal >= 10%)   30% emptied at 2 hr ( normal >= 40%)   48% emptied at 3 hr ( normal >= 70%)   69% emptied at 4 hr ( normal >= 90%)   IMPRESSION: Delayed gastric emptying study.

## 2024-09-19 ENCOUNTER — Ambulatory Visit (HOSPITAL_COMMUNITY)
Admission: RE | Admit: 2024-09-19 | Discharge: 2024-09-19 | Disposition: A | Source: Ambulatory Visit | Attending: Gastroenterology

## 2024-09-19 ENCOUNTER — Encounter (HOSPITAL_COMMUNITY): Payer: Self-pay | Admitting: Gastroenterology

## 2024-09-19 ENCOUNTER — Ambulatory Visit (HOSPITAL_COMMUNITY): Admitting: Anesthesiology

## 2024-09-19 ENCOUNTER — Encounter (HOSPITAL_COMMUNITY)
Admission: RE | Disposition: A | Discharge status: Home / Self Care | Payer: Self-pay | Source: Home / Self Care | Attending: Gastroenterology

## 2024-09-19 ENCOUNTER — Ambulatory Visit (HOSPITAL_COMMUNITY)
Admission: RE | Admit: 2024-09-19 | Discharge: 2024-09-19 | Disposition: A | Discharge status: Home / Self Care | Attending: Gastroenterology | Admitting: Gastroenterology

## 2024-09-19 ENCOUNTER — Telehealth (INDEPENDENT_AMBULATORY_CARE_PROVIDER_SITE_OTHER): Payer: Self-pay | Admitting: Gastroenterology

## 2024-09-19 ENCOUNTER — Other Ambulatory Visit: Payer: Self-pay

## 2024-09-19 DIAGNOSIS — E66813 Obesity, class 3: Secondary | ICD-10-CM | POA: Insufficient documentation

## 2024-09-19 DIAGNOSIS — F419 Anxiety disorder, unspecified: Secondary | ICD-10-CM | POA: Diagnosis not present

## 2024-09-19 DIAGNOSIS — K222 Esophageal obstruction: Secondary | ICD-10-CM | POA: Insufficient documentation

## 2024-09-19 DIAGNOSIS — R1013 Epigastric pain: Secondary | ICD-10-CM

## 2024-09-19 DIAGNOSIS — K209 Esophagitis, unspecified without bleeding: Secondary | ICD-10-CM

## 2024-09-19 DIAGNOSIS — F32A Depression, unspecified: Secondary | ICD-10-CM | POA: Diagnosis not present

## 2024-09-19 DIAGNOSIS — R1310 Dysphagia, unspecified: Secondary | ICD-10-CM | POA: Insufficient documentation

## 2024-09-19 DIAGNOSIS — R0989 Other specified symptoms and signs involving the circulatory and respiratory systems: Secondary | ICD-10-CM | POA: Diagnosis not present

## 2024-09-19 DIAGNOSIS — Z79899 Other long term (current) drug therapy: Secondary | ICD-10-CM | POA: Diagnosis not present

## 2024-09-19 DIAGNOSIS — R10816 Epigastric abdominal tenderness: Secondary | ICD-10-CM | POA: Diagnosis not present

## 2024-09-19 DIAGNOSIS — I10 Essential (primary) hypertension: Secondary | ICD-10-CM | POA: Insufficient documentation

## 2024-09-19 DIAGNOSIS — K2970 Gastritis, unspecified, without bleeding: Secondary | ICD-10-CM

## 2024-09-19 DIAGNOSIS — K317 Polyp of stomach and duodenum: Secondary | ICD-10-CM | POA: Diagnosis not present

## 2024-09-19 DIAGNOSIS — K2951 Unspecified chronic gastritis with bleeding: Secondary | ICD-10-CM | POA: Diagnosis not present

## 2024-09-19 DIAGNOSIS — G43909 Migraine, unspecified, not intractable, without status migrainosus: Secondary | ICD-10-CM | POA: Insufficient documentation

## 2024-09-19 DIAGNOSIS — Z6841 Body Mass Index (BMI) 40.0 and over, adult: Secondary | ICD-10-CM | POA: Diagnosis not present

## 2024-09-19 DIAGNOSIS — M329 Systemic lupus erythematosus, unspecified: Secondary | ICD-10-CM | POA: Insufficient documentation

## 2024-09-19 DIAGNOSIS — K2101 Gastro-esophageal reflux disease with esophagitis, with bleeding: Secondary | ICD-10-CM | POA: Insufficient documentation

## 2024-09-19 HISTORY — PX: ESOPHAGOGASTRODUODENOSCOPY: SHX5428

## 2024-09-19 LAB — GLUCOSE, CAPILLARY: Glucose-Capillary: 107 mg/dL — ABNORMAL HIGH (ref 70–99)

## 2024-09-19 LAB — POCT PREGNANCY, URINE: Preg Test, Ur: NEGATIVE

## 2024-09-19 MED ORDER — LIDOCAINE 2% (20 MG/ML) 5 ML SYRINGE
INTRAMUSCULAR | Status: DC | PRN
  Administered 2024-09-19: 100 mg via INTRAVENOUS

## 2024-09-19 MED ORDER — LACTATED RINGERS IV SOLN: INTRAVENOUS | Status: DC

## 2024-09-19 MED ORDER — VONOPRAZAN FUMARATE 10 MG PO TABS: 20.0000 mg | ORAL_TABLET | Freq: Every day | ORAL | 2 refills | Status: AC

## 2024-09-19 MED ORDER — PROPOFOL 500 MG/50ML IV EMUL
INTRAVENOUS | Status: DC | PRN
  Administered 2024-09-19: 200 ug/kg/min via INTRAVENOUS

## 2024-09-19 MED ORDER — VOQUEZNA 20 MG PO TABS: 20.0000 mg | ORAL_TABLET | Freq: Every day | ORAL | Status: AC

## 2024-09-19 NOTE — Op Note (Signed)
 [facility] Patient Name: Monica Ashley Procedure Date: 09/19/2024 12:19 PM MRN: [id number] Date of Birth: 1981-07-10 Attending MD: Deatrice Dine , MD, [id number] CSN: [id number] Age: 44 Admit Type: Outpatient Procedure:                Upper GI endoscopy Indications:              Dysphagia, Esophageal reflux symptoms that persist                            despite appropriate therapy, Hematemesis Providers:                Deatrice Dine, MD, Charlena Edison, RN, Jon LABOR.                            Gerome RN, RN Referring MD:              Medicines:                Monitored Anesthesia Care Complications:            No immediate complications. Estimated Blood Loss:     Estimated blood loss was minimal. Procedure:                Pre-Anesthesia Assessment:                           - Prior to the procedure, a History and Physical                            was performed, and patient medications and                            allergies were reviewed. The patient's tolerance of                            previous anesthesia was also reviewed. The risks                            and benefits of the procedure and the sedation                            options and risks were discussed with the patient.                            All questions were answered, and informed consent                            was obtained. Prior Anticoagulants: The patient has                            taken no anticoagulant or antiplatelet agents. ASA                            Grade Assessment: II - A patient with mild systemic  disease. After reviewing the risks and benefits,                            the patient was deemed in satisfactory condition to                            undergo the procedure.                           After obtaining informed consent, the endoscope was                            passed under direct vision. Throughout the                             procedure, the patient's blood pressure, pulse, and                            oxygen saturations were monitored continuously. The                            [id number] ([id number]) Upper was introduced through                            the mouth, and advanced to the second part of                            duodenum. The upper GI endoscopy was accomplished                            without difficulty. The patient tolerated the                            procedure well. Scope In: 12:32:47 PM Scope Out: 12:44:23 PM Total Procedure Duration: 0 hours 11 minutes 36 seconds  Findings:      LA Grade D (one or more mucosal breaks involving at least 75% of       esophageal circumference) esophagitis with bleeding was found in the       middle and lower thirds of the esophagus. Biopsies were taken with a       cold forceps for histology.      A non-obstructing Schatzki ring was found at the gastroesophageal       junction. A TTS dilator was passed through the scope. Dilation with an       18-19-20 mm balloon dilator was performed to 19 mm. The dilation site       was examined following endoscope reinsertion and showed mild mucosal       disruption.      A single 8 mm sessile polyp with no bleeding and no stigmata of recent       bleeding was found in the gastric fundus. The polyp was removed with a       cold snare. Resection and retrieval were complete.      Mild inflammation characterized by erythema was found in the stomach.       Biopsies were taken with a  cold forceps for histology.      The duodenal bulb and second portion of the duodenum were normal. Impression:               - LA Grade D reflux esophagitis with bleeding.                            Biopsied.                           - Non-obstructing Schatzki ring. Dilated.                           - A single gastric polyp. Resected and retrieved.                           - Gastritis. Biopsied.                           - Normal  duodenal bulb and second portion of the                            duodenum. Moderate Sedation:      Per Anesthesia Care Recommendation:           - Patient has a contact number available for                            emergencies. The signs and symptoms of potential                            delayed complications were discussed with the                            patient. Return to normal activities tomorrow.                            Written discharge instructions were provided to the                            patient.                           - Resume previous diet.                           - Continue present medications.                           - Await pathology results.                           - Repeat upper endoscopy in 3 months to check                            healing and to evaluate the response to therapy.                           -  Patient is maximum dose of PPI and continues to                            have severe esophagitis ; will switch to PCAB                           -Start Reglan  for gastroparesis(positiev GES study)                            and repeat EKG in 2-3 days Procedure Code(s):        --- Professional ---                           [phone], Esophagogastroduodenoscopy, flexible,                            transoral; with removal of tumor(s), polyp(s), or                            other lesion(s) by snare technique                           43249, Esophagogastroduodenoscopy, flexible,                            transoral; with transendoscopic balloon dilation of                            esophagus (less than 30 mm diameter)                           43239, 59, Esophagogastroduodenoscopy, flexible,                            transoral; with biopsy, single or multiple Diagnosis Code(s):        --- Professional ---                           K21.01, Gastro-esophageal reflux disease with                            esophagitis, with bleeding                            K22.2, Esophageal obstruction                           K31.7, Polyp of stomach and duodenum                           K29.70, Gastritis, unspecified, without bleeding                           R13.10, Dysphagia, unspecified                           K92.0, Hematemesis CPT copyright 2022  American Medical Association. All rights reserved. The codes documented in this report are preliminary and upon coder review may  be revised to meet current compliance requirements. Deatrice Dine, MD Deatrice Dine, MD 09/19/2024 12:55:03 PM This report has been signed electronically. Number of Addenda: 0

## 2024-09-19 NOTE — Anesthesia Preprocedure Evaluation (Addendum)
"                                    Anesthesia Evaluation  Patient identified by MRN, date of birth, ID band Patient awake    Reviewed: Allergy & Precautions, H&P , NPO status , Patient's Chart, lab work & pertinent test results, reviewed documented beta blocker date and time   Airway Mallampati: II  TM Distance: >3 FB Neck ROM: full    Dental no notable dental hx.    Pulmonary neg pulmonary ROS   Pulmonary exam normal breath sounds clear to auscultation       Cardiovascular Exercise Tolerance: Good hypertension, negative cardio ROS  Rhythm:regular Rate:Normal     Neuro/Psych  Headaches PSYCHIATRIC DISORDERS Anxiety Depression       GI/Hepatic negative GI ROS, Neg liver ROS,,,  Endo/Other    Class 3 obesity  Renal/GU negative Renal ROS  negative genitourinary   Musculoskeletal   Abdominal   Peds  Hematology negative hematology ROS (+)   Anesthesia Other Findings   Reproductive/Obstetrics negative OB ROS                              Anesthesia Physical Anesthesia Plan  ASA: 3  Anesthesia Plan: MAC   Post-op Pain Management:    Induction:   PONV Risk Score and Plan: Propofol  infusion  Airway Management Planned:   Additional Equipment:   Intra-op Plan:   Post-operative Plan:   Informed Consent: I have reviewed the patients History and Physical, chart, labs and discussed the procedure including the risks, benefits and alternatives for the proposed anesthesia with the patient or authorized representative who has indicated his/her understanding and acceptance.     Dental Advisory Given  Plan Discussed with: CRNA  Anesthesia Plan Comments:          Anesthesia Quick Evaluation  "

## 2024-09-19 NOTE — Transfer of Care (Signed)
 Immediate Anesthesia Transfer of Care Note  Patient: Iyonnah B Limes  Procedure(s) Performed: EGD (ESOPHAGOGASTRODUODENOSCOPY)  Patient Location: PACU  Anesthesia Type:MAC  Level of Consciousness: awake  Airway & Oxygen Therapy: Patient Spontanous Breathing and Patient connected to nasal cannula oxygen  Post-op Assessment: Report given to RN and Post -op Vital signs reviewed and stable  Post vital signs: Reviewed  Last Vitals:  Vitals Value Taken Time  BP    Temp    Pulse    Resp    SpO2      Last Pain:  Vitals:   09/19/24 1229  TempSrc:   PainSc: 6       Patients Stated Pain Goal: 8 (09/19/24 1030)  Complications: There were no known notable events for this encounter.

## 2024-09-19 NOTE — Interval H&P Note (Signed)
 History and Physical Interval Note:  09/19/2024 12:23 PM  Monica Ashley  has presented today for surgery, with the diagnosis of gastroparesis, abdominal pain, dysphagia, hematemesis.  The various methods of treatment have been discussed with the patient and family. After consideration of risks, benefits and other options for treatment, the patient has consented to  Procedures with comments: EGD (ESOPHAGOGASTRODUODENOSCOPY) (N/A) - 12:30pm, ASA 1-2 as a surgical intervention.  The patient's history has been reviewed, patient examined, no change in status, stable for surgery.  I have reviewed the patient's chart and labs.  Questions were answered to the patient's satisfaction.     Deatrice FALCON Twan Harkin

## 2024-09-19 NOTE — Telephone Encounter (Signed)
 PA received for Voquezna  10 mg tablets. Completed PA via cover my meds. Await decision

## 2024-09-19 NOTE — Discharge Instructions (Addendum)
" °  Discharge instructions Please read the instructions outlined below and refer to this sheet in the next few weeks. These discharge instructions provide you with general information on caring for yourself after you leave the hospital. Your doctor may also give you specific instructions. While your treatment has been planned according to the most current medical practices available, unavoidable complications occasionally occur. If you have any problems or questions after discharge, please call your doctor. ACTIVITY You may resume your regular activity but move at a slower pace for the next 24 hours.  Take frequent rest periods for the next 24 hours.  Walking will help expel (get rid of) the air and reduce the bloated feeling in your abdomen.  No driving for 24 hours (because of the anesthesia (medicine) used during the test).  You may shower.  Do not sign any important legal documents or operate any machinery for 24 hours (because of the anesthesia used during the test).  NUTRITION Drink plenty of fluids.  You may resume your normal diet.  Begin with a light meal and progress to your normal diet.  Avoid alcoholic beverages for 24 hours or as instructed by your caregiver.  MEDICATIONS You may resume your normal medications unless your caregiver tells you otherwise.  WHAT YOU CAN EXPECT TODAY You may experience abdominal discomfort such as a feeling of fullness or gas pains.  FOLLOW-UP Your doctor will discuss the results of your test with you.  SEEK IMMEDIATE MEDICAL ATTENTION IF ANY OF THE FOLLOWING OCCUR: Excessive nausea (feeling sick to your stomach) and/or vomiting.  Severe abdominal pain and distention (swelling).  Trouble swallowing.  Temperature over 101 F (37.8 C).  Rectal bleeding or vomiting of blood.     Start Vonoprazan  Start Reglan  Get EKG with PCP in 2-3 days after starting Reglan    I hope you have a great rest of your week!   Monica Ashley ,  M.D.. Gastroenterology and Hepatology [facility]    "

## 2024-09-19 NOTE — Telephone Encounter (Signed)
 Monica Deatrice FALCON, MD  Arthurine Merle, LPN Hi Merle Can you please have atleast 1 week supply of Voquezna  20mg  ready for this patient  Medication Samples have been provided to the patient.  Drug name: Voquezna        Strength: 20mg         Qty: 2 boxes (#10)   LOT: [id number]  Exp.Date: 06/2025  Dosing instructions: take one tablet po daily  The patient has been instructed regarding the correct time, dose, and frequency of taking this medication, including desired effects and most common side effects.   Merle Arthurine 1:26 PM 09/19/2024

## 2024-09-20 ENCOUNTER — Encounter (HOSPITAL_COMMUNITY): Payer: Self-pay | Admitting: Gastroenterology

## 2024-09-21 LAB — SURGICAL PATHOLOGY

## 2024-09-21 NOTE — Telephone Encounter (Signed)
 Octavia Zelada (Key: [id number]) PA Case ID #: [id number] Rx #: [id number] Need Help? Call us  at [phone] Outcome Approved today by [facility] Commercial [facility] 2017 Approved. Effective Date: 09/19/2024 Authorization Expiration Date: 09/19/2025 Drug Voquezna  10MG  tablets ePA cloud logo Form [employer] [place] Commercial Electronic Request Form Original Claim Info [id number] Product/Service Not Covered Plan/Benefit Exclusion

## 2024-09-21 NOTE — Patient Instructions (Signed)
Hyperparathyroidism  Hyperparathyroidism is a condition that is caused by one or more overactive parathyroid glands. There are four parathyroid glands in the front of your neck on or near your thyroid gland. They are the size of a pea. These glands produce a chemical called parathyroid hormone, or PTH. Parathyroid glands release PTH when your blood calcium level is low to ensure a constant supply of calcium. Calcium is needed to make your bones, heart, and muscles healthy. There are two types of hyperparathyroidism: Primary hyperparathyroidism. Secondary hyperparathyroidism. What are the causes? The cause of this condition depends on the type of hyperparathyroidism that you have. Primary hyperparathyroidism is caused by: A benign tumor in one or more of the parathyroid glands (parathyroid adenoma). The adenoma releases too much PTH. This causes high levels of calcium in the blood and may lead to symptoms. PTH raises calcium levels by: Causing your bones to release calcium. Increasing the amount of calcium absorbed from the foods you eat. Making your kidneys retain too much calcium. Genes. In rare cases, primary hyperparathyroidism can be passed down through families. Secondary hyperparathyroidism is usually caused by conditions that cause low calcium. Low calcium triggers the parathyroid glands to make and release more PTH. The glands may become overactive (hyperplasia) and release too much PTH. These conditions are: Kidney failure. When kidneys fail, they are unable to make vitamin D or remove phosphorus, which leads to low calcium levels. Vitamin D deficiency. What increases the risk? You are more likely to develop this condition if: You are a woman. You are 50 years or older. You have chronic kidney disease. What are the signs or symptoms? Mild hyperparathyroidism may not cause any symptoms. If you start to have symptoms, they may include: Muscle weakness. Aches and pains. Feeling tired  (fatigue). Depression. Difficulty thinking. Bone pain or bone fracture from thinning bones. This is from too much calcium in the body. Back or groin pain from kidney stones. This happens if your kidneys retain too much calcium. Symptoms of more severe disease may include: Nausea and vomiting. Increased thirst. Bowel movements that are less frequent or more difficult than usual (constipation). Urinating more often than normal. Confusion. How is this diagnosed? Your health care provider may suspect hyperparathyroidism if the results of a routine blood test show a high level of calcium. In this case, your health care provider may do another blood test to check your PTH level. You may also have other tests to find out the cause of your condition and check for evidence of bone loss or kidney stones. These tests may include: A 24-hour urine collection to find out if you have primary or secondary hyperparathyroidism. A bone scan test to check bone density (DXA scan). Imaging tests of your kidney, such as X-ray, ultrasound, or CT scan. Blood tests for vitamin D and phosphorus. An ultrasound of your parathyroid glands. How is this treated? Mild cases of hyperparathyroidism may not need to be treated. If you are not treated, you will need to have regular blood tests to make sure your calcium and PTH levels are not getting too high. Surgery is the usual treatment if you have primary hyperparathyroidism. Surgery will usually cure the condition. Surgery may be recommended if: You have high calcium. You have low bone density. You have kidney stones. You have weak bones causing a broken bone (fracture). You are younger than 48. Secondary hyperparathyroidism is managed by treating the underlying disorder. For chronic kidney disease: Reducing phosphorus in the diet or taking phosphorus  binders for high levels of phosphorus. A medicine that acts on the parathyroid glands to lower PTH  (cinacalcet). Dialysis or kidney transplant. For vitamin D deficiency, you may be given vitamin D supplements. Follow these instructions at home: Eating and drinking  Follow instructions from your health care provider about eating or drinking. Take supplements only as told by your health care provider. Drink enough fluid to keep your urine pale yellow. General instructions Take over-the-counter and prescription medicines only as told by your health care provider. Exercise as told by your health care provider. Weight-bearing exercise is good for bone health. Ask your health care provider what exercises are safe for you. Return to your normal activities as told by your health care provider. Ask your health care provider what activities are safe for you. Keep all follow-up visits. Your health care provider will need to do tests regularly and adjust your treatment as needed. Where to find more information [employer] of Diabetes and Digestive and Kidney Diseases: [email or web address] National Kidney Foundation: kidney.org Contact a health care provider if: Your symptoms do not get better with treatment. Your symptoms get worse. Summary Hyperparathyroidism is a condition caused by one or more overactive parathyroid glands. The parathyroid glands produce a chemical called parathyroid hormone, or PTH. Too much parathyroid hormone causes high levels of blood calcium and may lead to symptoms of hyperparathyroidism. There are two types of hyperparathyroidism: primary and secondary. If this condition is mild, you may not have any symptoms. Hyperparathyroidism is often diagnosed when a routine blood test finds high levels of calcium. Surgery is the best treatment for primary disease with symptoms. If surgery cannot be done, medicines may be of help for people who have mild secondary disease or primary disease. This information is not intended to replace advice given to you by your health care provider.  Make sure you discuss any questions you have with your health care provider. Document Revised: 10/24/2021 Document Reviewed: 10/24/2021 Elsevier Patient Education  2024 [employer].

## 2024-09-22 ENCOUNTER — Ambulatory Visit (INDEPENDENT_AMBULATORY_CARE_PROVIDER_SITE_OTHER): Payer: Self-pay | Admitting: Gastroenterology

## 2024-09-22 LAB — CELIAC AB TTG DGP TIGA
Deamidated Gliadin Abs, IgA: 5 U (ref 0–19)
Deamidated Gliadin Abs, IgG: 2 U (ref 0–19)
Immunoglobulin A, (IgA) QN, Serum: 408 mg/dL — ABNORMAL HIGH (ref 87–352)
t-Transglutaminase (tTG) IgA: <2 U/mL (ref 0–3)
t-Transglutaminase (tTG) IgG: 4 U/mL (ref 0–5)

## 2024-09-22 LAB — ALPHA-GAL PANEL
Allergen Lamb IgE: <0.1 kU/L
Beef IgE: <0.1 kU/L
IgE (Immunoglobulin E), Serum: 6 [IU]/mL (ref 6–495)
O215-IgE Alpha-Gal: <0.1 kU/L
Pork IgE: <0.1 kU/L

## 2024-09-22 LAB — C-REACTIVE PROTEIN: CRP: 2 mg/L (ref 0–10)

## 2024-09-22 LAB — FERRITIN: Ferritin: 42 ng/mL (ref 15–150)

## 2024-09-25 NOTE — Anesthesia Postprocedure Evaluation (Signed)
"   Anesthesia Post Note  Patient: Monica Ashley  Procedure(s) Performed: EGD (ESOPHAGOGASTRODUODENOSCOPY)  Patient location during evaluation: Phase II Anesthesia Type: MAC Level of consciousness: awake Pain management: pain level controlled Vital Signs Assessment: post-procedure vital signs reviewed and stable Respiratory status: spontaneous breathing and respiratory function stable Cardiovascular status: blood pressure returned to baseline and stable Postop Assessment: no headache and no apparent nausea or vomiting Anesthetic complications: no Comments: Late entry   There were no known notable events for this encounter.   Last Vitals:  Vitals:   09/19/24 1030 09/19/24 1248  BP: 120/85 103/63  Pulse: 73 91  Resp: 16 20  Temp: 37 C 36.6 C  SpO2: 97% 97%    Last Pain:  Vitals:   09/19/24 1248  TempSrc: Oral  PainSc: 0-No pain                 Yvonna PARAS [name]      "

## 2024-09-27 ENCOUNTER — Ambulatory Visit: Admitting: Nurse Practitioner

## 2024-09-27 ENCOUNTER — Encounter: Payer: Self-pay | Admitting: Nurse Practitioner

## 2024-09-27 DIAGNOSIS — R7989 Other specified abnormal findings of blood chemistry: Secondary | ICD-10-CM

## 2024-09-27 DIAGNOSIS — E213 Hyperparathyroidism, unspecified: Secondary | ICD-10-CM

## 2024-09-27 NOTE — Progress Notes (Signed)
 "                                                           Endocrinology Consult Note       09/27/2024, 9:53 AM   SUBJECTIVE:  Monica Ashley is a 44 y.o.-year-old female, referred by her  Vida Mardy DEL, PA-C  , for evaluation for hypercalcemia/hyperparathyroidism.  Past Medical History:  Diagnosis Date   Anxiety    Bilateral hearing loss    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 ear    Depression    Elevated LFTs    Enlarged liver    Enlarged lymph node    Gastroparesis    Leukopenia    Lupus    Migraine    Obesity    Raynaud disease    Spleen enlarged    Systemic lupus erythematosus (HCC)     Past Surgical History:  Procedure Laterality Date   Bladder stretched     as a child   CESAREAN SECTION  01/12/2003   CESAREAN SECTION  11/24/2004   CHOLECYSTECTOMY     ESOPHAGOGASTRODUODENOSCOPY N/A 09/19/2024   Procedure: EGD (ESOPHAGOGASTRODUODENOSCOPY);  Surgeon: Cinderella Deatrice FALCON, MD;  Location: [facility];  Service: Endoscopy;  Laterality: N/A;  12:30pm, ASA 1-2   TONSILLECTOMY AND ADENOIDECTOMY  2009    Social History[1]  Family History  Problem Relation Age of Onset   Gout Mother    Hypertension Mother    Autoimmune disease Mother    Seizures Mother    Migraines Mother    Arrhythmia Mother    Gout Father    Diabetes Maternal Grandmother    Hypertension Maternal Grandmother    CAD Maternal Grandmother    Stroke Maternal Grandmother    Stroke Maternal Grandfather    Hypertension Paternal Grandmother    Diabetes Paternal Grandmother    Hypertension Paternal Grandfather    Hyperlipidemia Paternal Grandfather    CAD Paternal Grandfather    Heart attack Paternal Grandfather    Heart defect Daughter        at birth   Deafness Son        left ear    Outpatient Encounter Medications as of 09/27/2024  Medication Sig   albuterol  (PROVENTIL  HFA;VENTOLIN  HFA) 108 (90 BASE) MCG/ACT inhaler Inhale 1-2 puffs into the lungs every 6 (six) hours as needed for wheezing or shortness of  breath.   amLODipine  (NORVASC ) 5 MG tablet Take 1 tablet (5 mg total) by mouth daily.   aspirin  EC 81 MG tablet Take 1 tablet by mouth daily.   azaTHIOprine  (IMURAN ) 50 MG tablet TAKE TWO TABLETS BY MOUTH EVERY DAY   Cholecalciferol  (VITAMIN D -1000 MAX ST) 25 MCG (1000 UT) tablet Take by mouth.   Cyanocobalamin  (VITAMIN B 12 PO) Take by mouth.   diphenhydrAMINE  (BENADRYL  ALLERGY) 25 MG tablet 1 tablet as needed Orally every 6 hrs (Patient taking differently: 25 mg. As needed)   Docusate Sodium  (DSS) 100 MG CAPS Take 1 capsule by mouth daily.   Ferrous Sulfate (IRON PO) Take by mouth daily.   gabapentin (NEURONTIN) 300 MG capsule Take 600 mg by mouth daily.    hydroxychloroquine  (PLAQUENIL ) 200 MG tablet Take 2 tablets (400 mg total) by mouth daily.   lidocaine  (XYLOCAINE ) 2 % solution 15 ml as needed swallow every 6 hrs  as needed; Duration: 7 days (Patient taking differently: As needed)   meclizine (ANTIVERT) 25 MG tablet Take 25 mg by mouth 3 (three) times daily as needed for dizziness.   metFORMIN (GLUCOPHAGE) 500 MG tablet Take 500 mg by mouth 2 (two) times daily.   metoCLOPramide  (REGLAN ) 10 MG tablet Take 1 tablet (10 mg total) by mouth 3 (three) times daily before meals.   metoprolol succinate (TOPROL-XL) 25 MG 24 hr tablet Take 25 mg by mouth daily.   mirtazapine (REMERON) 30 MG tablet Take 30 mg by mouth at bedtime.   Multiple Vitamin (MULTIVITAMIN PO) Take by mouth.   ondansetron  (ZOFRAN -ODT) 4 MG disintegrating tablet dissolve 1 tablet in mouth every 8 hours as needed for nausea   promethazine  (PHENERGAN ) 25 MG tablet Take 25 mg by mouth every 6 (six) hours as needed for nausea.   rizatriptan (MAXALT-MLT) 5 MG disintegrating tablet Take 5 mg by mouth as needed. May repeat in 2 hours if needed   rosuvastatin (CRESTOR) 20 MG tablet Take 1 tablet by mouth daily.   topiramate (TOPAMAX) 25 MG tablet Take 25 mg by mouth daily.   venlafaxine XR (EFFEXOR-XR) 37.5 MG 24 hr capsule Take 37.5  mg by mouth daily.   Vonoprazan Fumarate  (VOQUEZNA ) 20 MG TABS Take 20 mg by mouth daily.   Vonoprazan Fumarate  10 MG TABS Take 20 mg by mouth daily.   No facility-administered encounter medications on file as of 09/27/2024.    Allergies[2]   HPI  Mikaili B Chenier was diagnosed with hypercalcemia in 2025.  Patient has no previously known history of parathyroid, pituitary, adrenal dysfunctions; no family history of such dysfunctions. -Review of her referral package of most recent labs reveals calcium of 9.7.  She did have elevated PTH of 75 on 05/16/24.  I reviewed pt's DEXA scans: never done  No prior history of fragility fractures or falls. No history of kidney stones.  No history of CKD. Last BUN/Cr: 9/0.73 on 06/05/24  she is not on HCTZ or other thiazide therapy.  She does have history of vitamin D  deficiency- currently on OTC supplementation. Last vitamin D  level was 29.3 on 10/02/23.  she is not on calcium supplements-was previously using Tums but stopped that,  she eats dairy and green, leafy, vegetables on average amounts.  she does not have a family history of hypercalcemia, pituitary tumors, thyroid  cancer, or osteoporosis.   I reviewed her chart and she also has a history of migraines, raynaud syndrome, esophageal stricture, gastroparesis, vitamin d  deficiency, lupus, anemia, NAFLD, prediabetes.    Review of systems  Constitutional: + Minimally fluctuating body weight- has difficulty losing weight,  current Body mass index is 43.69 kg/m. , + fatigue, no subjective hyperthermia, no subjective hypothermia Eyes: no blurry vision, no xerophthalmia ENT: no sore throat, no nodules palpated in throat, no dysphagia/odynophagia, no hoarseness Cardiovascular: no chest pain, no shortness of breath, no palpitations, no leg swelling Respiratory: no cough, no shortness of breath Gastrointestinal: no nausea/vomiting/diarrhea, + constipation and abdominal pain- recently diagnosed with  gastroparesis Musculoskeletal: + diffuse muscle/joint aches- hx lupus Skin: no rashes, no hyperemia Neurological: no tremors, no numbness, no tingling, no dizziness Psychiatric: no depression, no anxiety ------------------------------------------------------------------------------------------------------------------------------------------ OBJECTIVE: BP 110/86 (BP Location: Right Arm, Patient Position: Sitting, Cuff Size: Large)   Pulse 64   Ht 5' 5.5 (1.664 m)   Wt 266 lb 9.6 oz (120.9 kg)   BMI 43.69 kg/m , Body mass index is 43.69 kg/m. Wt Readings from Last 3 Encounters:  09/27/24 266 lb 9.6 oz (120.9 kg)  08/30/24 265 lb 4.8 oz (120.3 kg)  06/05/24 267 lb (121.1 kg)     BP Readings from Last 3 Encounters:  09/27/24 110/86  09/19/24 103/63  08/30/24 127/81     Physical Exam- Limited  Constitutional:  Body mass index is 43.69 kg/m. , not in acute distress, normal state of mind Eyes:  EOMI, no exophthalmos Neck: Supple Thyroid : No gross goiter, no palpable nodularity Cardiovascular: RRR, no murmurs, rubs, or gallops, no edema Respiratory: Adequate breathing efforts, no crackles, rales, rhonchi, or wheezing Musculoskeletal: no gross deformities, strength intact in all four extremities, no gross restriction of joint movements Skin:  no rashes, no hyperemia Neurological: no tremor with outstretched hands   CMP ( most recent) CMP     Component Value Date/Time   NA 142 06/05/2024 1607   NA 145 (H) 05/23/2020 1638   K 3.6 06/05/2024 1607   CL 107 06/05/2024 1607   CO2 30 06/05/2024 1607   GLUCOSE 86 06/05/2024 1607   BUN 9 06/05/2024 1607   BUN 7 04/21/2024 0000   CREATININE 0.73 06/05/2024 1607   CALCIUM 9.7 06/05/2024 1607   PROT 6.9 06/05/2024 1607   ALBUMIN 3.6 09/17/2015 0840   AST 30 06/05/2024 1607   ALT 16 06/05/2024 1607   ALKPHOS 49 09/17/2015 0840   BILITOT 0.3 06/05/2024 1607   EGFR 104 06/05/2024 1607   GFRNONAA 84 05/23/2020 1638      Diabetic Labs (most recent): No results found for: HGBA1C, MICROALBUR   Lipid Panel ( most recent) Lipid Panel  No results found for: CHOL, TRIG, HDL, CHOLHDL, VLDL, LDLCALC, LDLDIRECT, LABVLDL    Lab Results  Component Value Date   TSH 4.09 04/21/2024   TSH 0.868 12/25/2016   FREET4 1.20 12/25/2016     ------------------------------------------------------------------------------------------------------------------------------- Assessment/Plan: 1. Hypercalcemia / Hyperparathyroidism  Patient has had several instances of elevated calcium, with the highest level being at 11.2 mg/dL. A corresponding intact PTH level was also high, at 75.  - Patient also has vitamin D  deficiency, with the last level being 29.3.  - No apparent complications from hypercalcemia/hyperparathyroidism: no history of nephrolithiasis, osteoporosis, fragility fractures. no major mood disorders.  She does have abdominal pain and bone pain (hx of gastroparesis and lupus).  She also notes she is on Metoprolol for PVCs.  - I discussed with the patient about the physiology of calcium and parathyroid hormone, and possible effects of increased PTH/ Calcium, including kidney stones, cardiac dysrhythmias, osteoporosis, abdominal pain, etc.   - The work up so far is not sufficient to reach a conclusion for definitive therapy.  she needs more studies to confirm and classify the parathyroid dysfunction she may have. I will proceed to obtain  repeat intact PTH/calcium, serum magnesium, serum phosphorus.  It is also essential to obtain 24-hour urine calcium/creatinine to rule out the rare but important cause of mild elevation in calcium and PTH- FHH ( Familial Hypocalciuric Hypercalcemia), which may not require any active intervention.  Will call patient with results and next steps.  If labs are suggestive of primary hyperparathyroidism, she will need confirmatory NM parathyroid scan to identify which  parathyroid gland is affected and will need referral to Dr. Eletha to discuss surgical removal.  We also discussed treating her high calcium with Sensipar, and the side effects of this medication, should she not be interested in surgery.  2. Abnormal TSH Her recent TSH level was high.  She does not have any  family history of thyroid  dysfunction that she is aware of, nor has she taken any medication for her thyroid  in the past.  Will check comprehensive thyroid  panel including thyroid  antibody testing to rule out autoimmune thyroid  dysfunction.  She also notes she had an ultrasound of her neck done at her PCP office and was told she had nodules but nothing else.  Will request copy of this.   - Time spent with the patient: 60 minutes, which was spent in obtaining information about her symptoms, reviewing her previous labs, evaluations, and treatments, counseling her about her hypercalcemia, and developing a plan to confirm the diagnosis and long term treatment as necessary.  Please refer to  Patient Self Inventory in the Media  tab for reviewed elements of pertinent patient history.  Kahmari B Kawabata participated in the discussions, expressed understanding, and voiced agreement with the above plans.  All questions were answered to her satisfaction. she is encouraged to contact clinic should she have any questions or concerns prior to her return visit.   Follow Up Plan: - Return labs at earliest convenience, will call with results and next steps.   Benton Rio, [facility] [address] [place], [place] [address] Phone: [phone] Fax: [phone]  09/27/2024, 9:53 AM     [1]  Social History Tobacco Use   Smoking status: Never    Passive exposure: Never   Smokeless tobacco: Never  Vaping Use   Vaping status: Never Used  Substance Use Topics   Alcohol use: No   Drug use: No  [2]  Allergies Allergen Reactions   Codeine Hives, Itching and  Rash   Ciprofloxacin  Itching    Has itching all over.   Hydrocodone  Itching   Tramadol  Itching    Takes Benadryl    "

## 2024-09-28 NOTE — Progress Notes (Signed)
 3 mth EGD noted in recall Patient result letter mailed procedure note and pathology result faxed to PCP

## 2024-10-02 NOTE — Progress Notes (Signed)
 Negative celiac and alpha gal CRP 2 Ferritin 42

## 2024-11-06 ENCOUNTER — Ambulatory Visit: Admitting: Internal Medicine
